# Patient Record
Sex: Female | Born: 1980 | Race: Black or African American | Hispanic: No | Marital: Single | Smoking: Never smoker
Health system: Southern US, Community
[De-identification: ages and names within clinical notes are randomized; demographics above are authoritative.]

## PROBLEM LIST (undated history)

## (undated) ENCOUNTER — Inpatient Hospital Stay (HOSPITAL_COMMUNITY): Payer: Self-pay

## (undated) DIAGNOSIS — K219 Gastro-esophageal reflux disease without esophagitis: Secondary | ICD-10-CM

## (undated) DIAGNOSIS — IMO0002 Reserved for concepts with insufficient information to code with codable children: Secondary | ICD-10-CM

## (undated) DIAGNOSIS — R51 Headache: Secondary | ICD-10-CM

## (undated) DIAGNOSIS — D649 Anemia, unspecified: Secondary | ICD-10-CM

## (undated) DIAGNOSIS — D219 Benign neoplasm of connective and other soft tissue, unspecified: Secondary | ICD-10-CM

## (undated) DIAGNOSIS — M7552 Bursitis of left shoulder: Secondary | ICD-10-CM

---

## 2000-05-06 ENCOUNTER — Ambulatory Visit (HOSPITAL_COMMUNITY): Admission: RE | Admit: 2000-05-06 | Discharge: 2000-05-06 | Payer: Self-pay | Admitting: *Deleted

## 2000-07-13 ENCOUNTER — Encounter (HOSPITAL_COMMUNITY): Admission: RE | Admit: 2000-07-13 | Discharge: 2000-07-23 | Payer: Self-pay | Admitting: Obstetrics & Gynecology

## 2000-07-21 ENCOUNTER — Inpatient Hospital Stay (HOSPITAL_COMMUNITY): Admission: AD | Admit: 2000-07-21 | Discharge: 2000-07-24 | Payer: Self-pay | Admitting: *Deleted

## 2000-08-24 ENCOUNTER — Inpatient Hospital Stay (HOSPITAL_COMMUNITY): Admission: AD | Admit: 2000-08-24 | Discharge: 2000-08-24 | Payer: Self-pay | Admitting: Obstetrics

## 2001-07-09 ENCOUNTER — Emergency Department (HOSPITAL_COMMUNITY): Admission: EM | Admit: 2001-07-09 | Discharge: 2001-07-09 | Payer: Self-pay | Admitting: Emergency Medicine

## 2001-08-02 ENCOUNTER — Emergency Department (HOSPITAL_COMMUNITY): Admission: EM | Admit: 2001-08-02 | Discharge: 2001-08-02 | Payer: Self-pay | Admitting: Emergency Medicine

## 2003-04-10 ENCOUNTER — Emergency Department (HOSPITAL_COMMUNITY): Admission: EM | Admit: 2003-04-10 | Discharge: 2003-04-10 | Payer: Self-pay | Admitting: Emergency Medicine

## 2004-01-28 DIAGNOSIS — IMO0002 Reserved for concepts with insufficient information to code with codable children: Secondary | ICD-10-CM

## 2004-01-28 DIAGNOSIS — R87619 Unspecified abnormal cytological findings in specimens from cervix uteri: Secondary | ICD-10-CM

## 2004-01-28 HISTORY — DX: Unspecified abnormal cytological findings in specimens from cervix uteri: R87.619

## 2004-01-28 HISTORY — DX: Reserved for concepts with insufficient information to code with codable children: IMO0002

## 2004-03-24 ENCOUNTER — Emergency Department (HOSPITAL_COMMUNITY): Admission: EM | Admit: 2004-03-24 | Discharge: 2004-03-24 | Payer: Self-pay | Admitting: Family Medicine

## 2004-06-30 ENCOUNTER — Emergency Department (HOSPITAL_COMMUNITY): Admission: EM | Admit: 2004-06-30 | Discharge: 2004-06-30 | Payer: Self-pay | Admitting: Emergency Medicine

## 2006-08-20 ENCOUNTER — Emergency Department (HOSPITAL_COMMUNITY): Admission: EM | Admit: 2006-08-20 | Discharge: 2006-08-20 | Payer: Self-pay | Admitting: Emergency Medicine

## 2008-02-11 ENCOUNTER — Emergency Department (HOSPITAL_COMMUNITY): Admission: EM | Admit: 2008-02-11 | Discharge: 2008-02-11 | Payer: Self-pay | Admitting: Family Medicine

## 2008-08-17 ENCOUNTER — Inpatient Hospital Stay (HOSPITAL_COMMUNITY): Admission: AD | Admit: 2008-08-17 | Discharge: 2008-08-22 | Payer: Self-pay | Admitting: Obstetrics and Gynecology

## 2008-08-18 ENCOUNTER — Encounter (INDEPENDENT_AMBULATORY_CARE_PROVIDER_SITE_OTHER): Payer: Self-pay | Admitting: Obstetrics & Gynecology

## 2008-08-18 ENCOUNTER — Encounter: Payer: Self-pay | Admitting: Obstetrics and Gynecology

## 2008-12-13 ENCOUNTER — Emergency Department (HOSPITAL_COMMUNITY): Admission: EM | Admit: 2008-12-13 | Discharge: 2008-12-13 | Payer: Self-pay | Admitting: Family Medicine

## 2009-12-17 IMAGING — US US OB COMP +14 WK
1 series · 14 of 28 positions shown · non-contrast
Comparison: none

OBSTETRICAL ULTRASOUND:
 This ultrasound exam was performed in the [HOSPITAL] Ultrasound Department.  The OB US report was generated in the AS system, and faxed to the ordering physician.  This report is also available in [REDACTED] PACS.

[Series 1: us ob comp +14 wk · 0.25mm/px · 71 acquisitions, 14 frames shown]
[im 3/71]
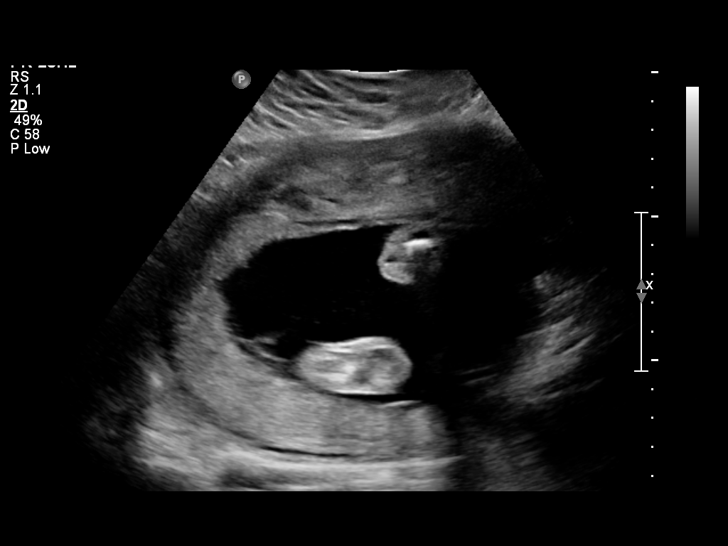
[im 8/71]
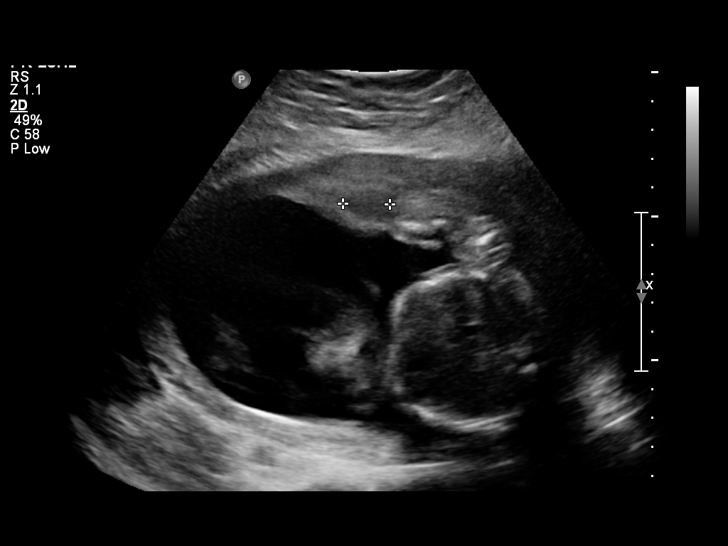
[im 13/71]
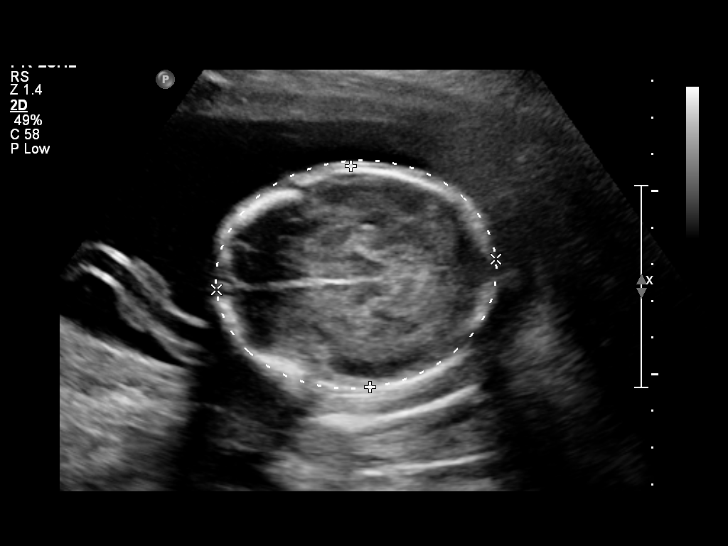
[im 19/71]
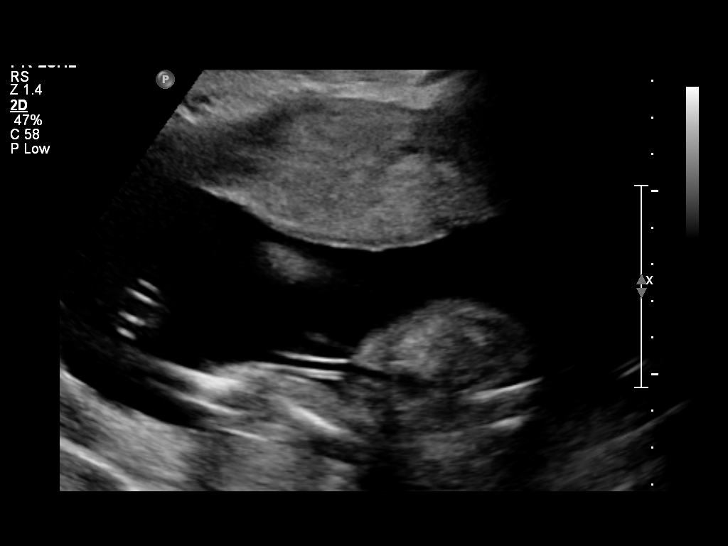
[im 24/71]
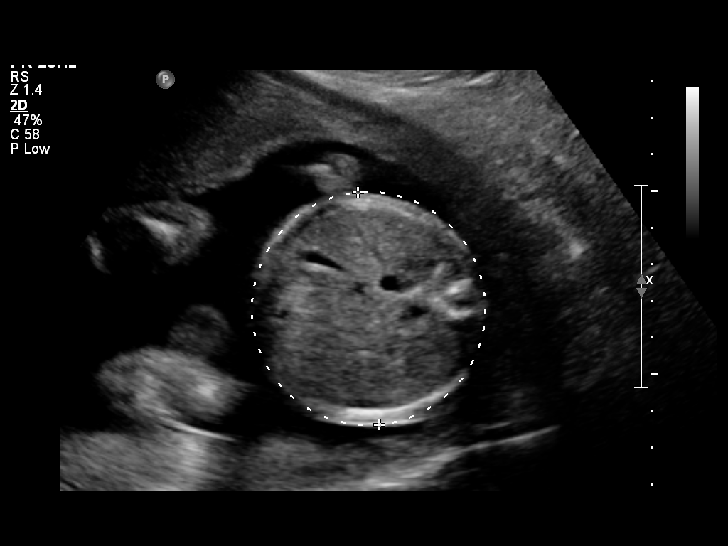
[im 29/71]
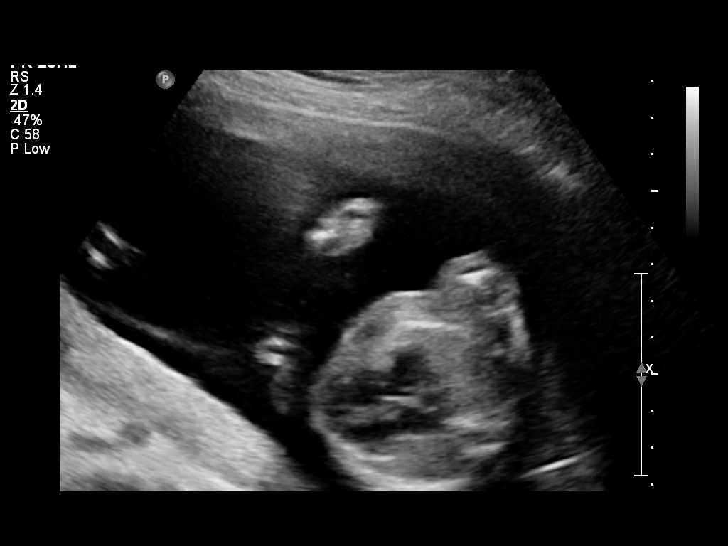
[im 34/71]
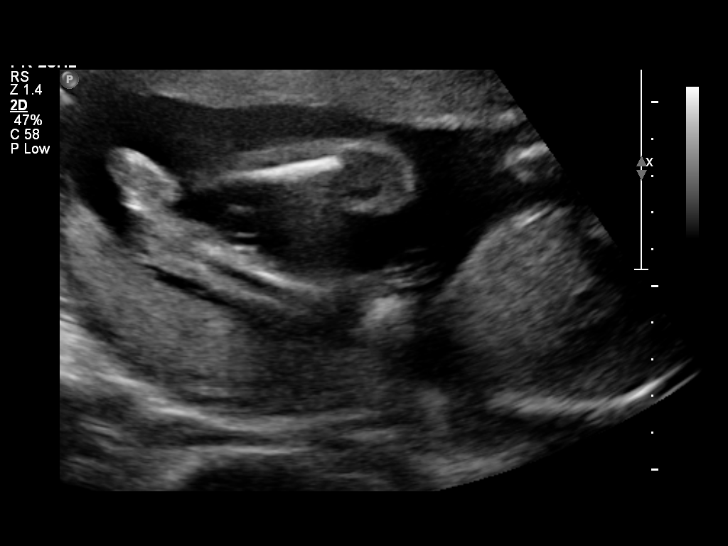
[im 39/71]
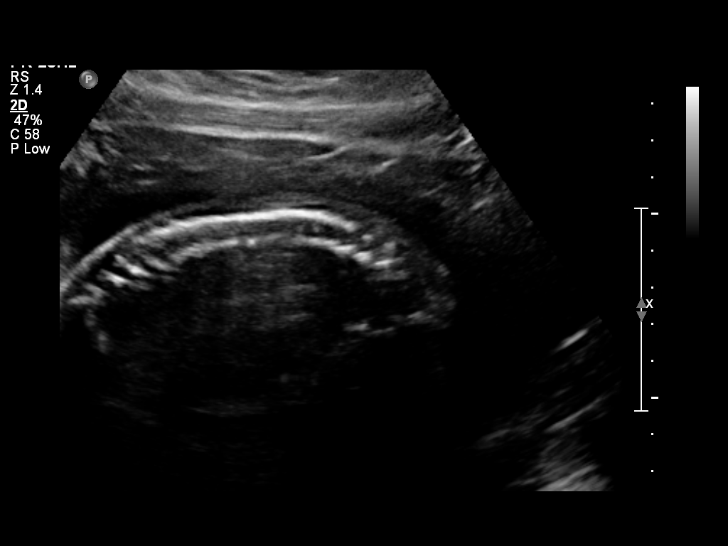
[im 45/71]
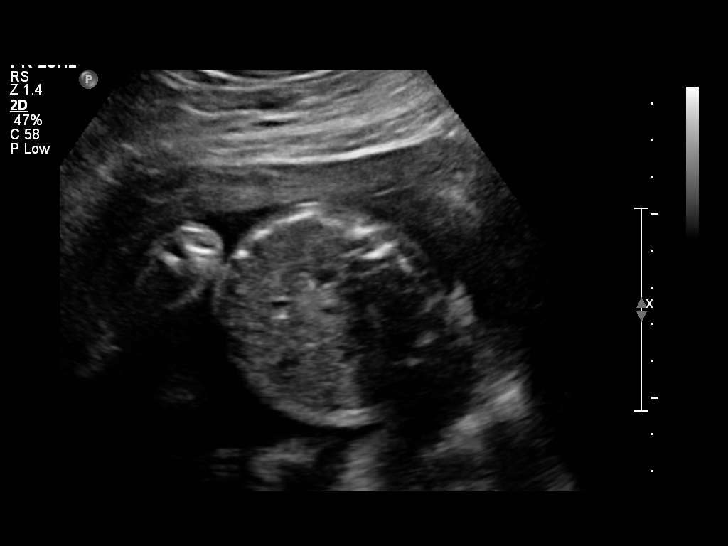
[im 50/71]
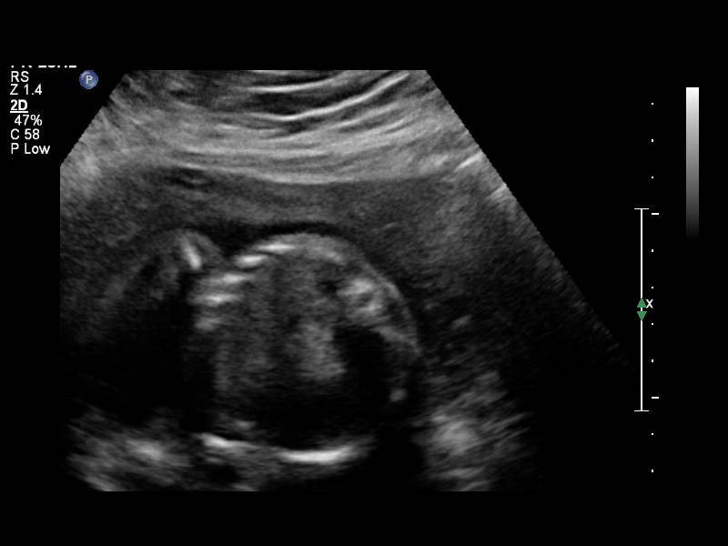
[im 55/71]
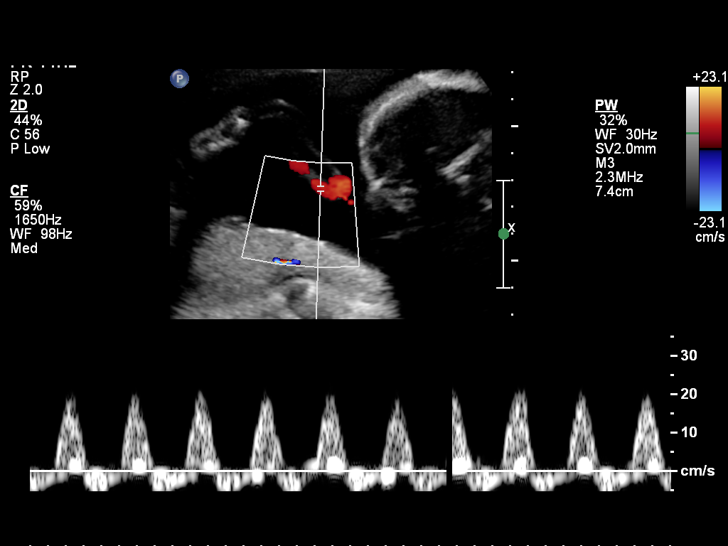
[im 60/71]
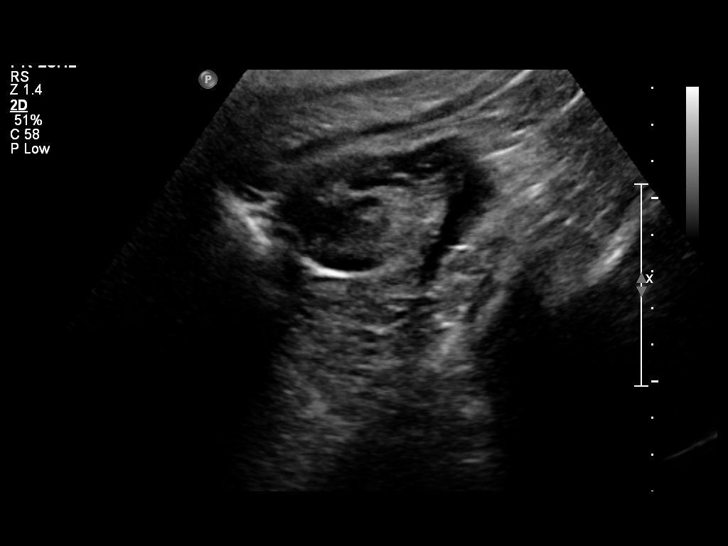
[im 65/71]
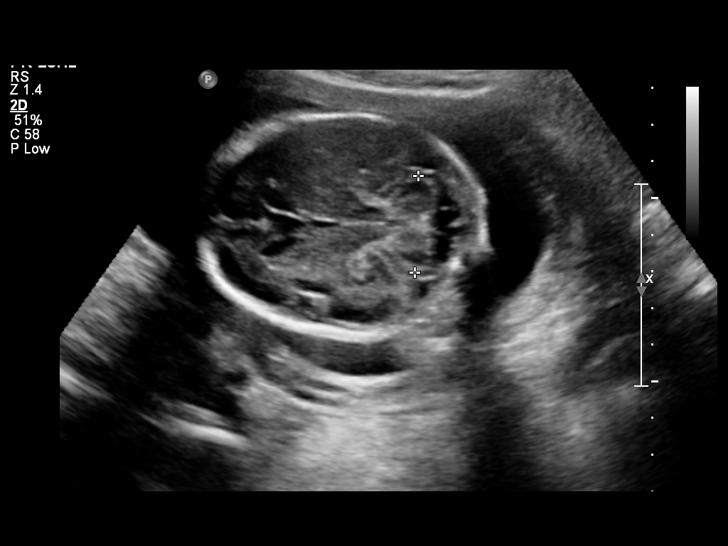
[im 71/71]
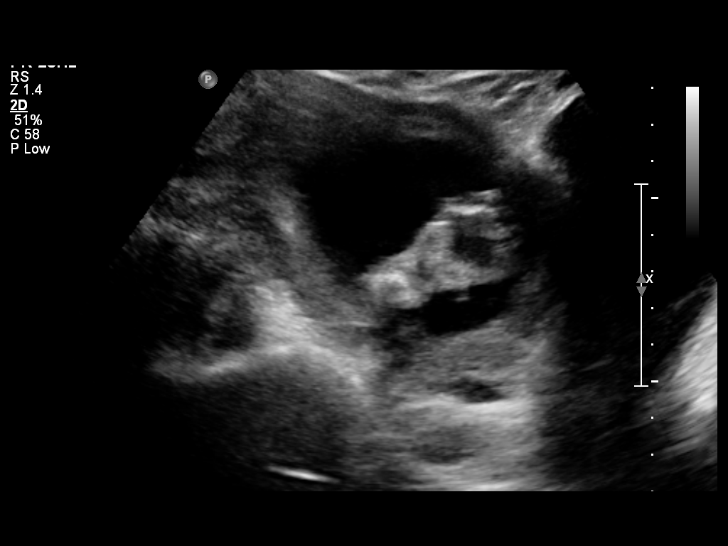

[14 of 28 positions shown; findings below may reference images not displayed]

IMPRESSION: See AS Obstetric US report.

## 2009-12-18 IMAGING — US US UA DOPPLER RE-EVAL
1 series · 10 of 10 positions shown · non-contrast
Comparison: none

OBSTETRICAL ULTRASOUND:
 This ultrasound was performed in The [HOSPITAL], and the AS OB/GYN report will be stored to [REDACTED] PACS.

[Series 1: us ua doppler re-eval · 10 of 10 slices shown]
[im 1/10]
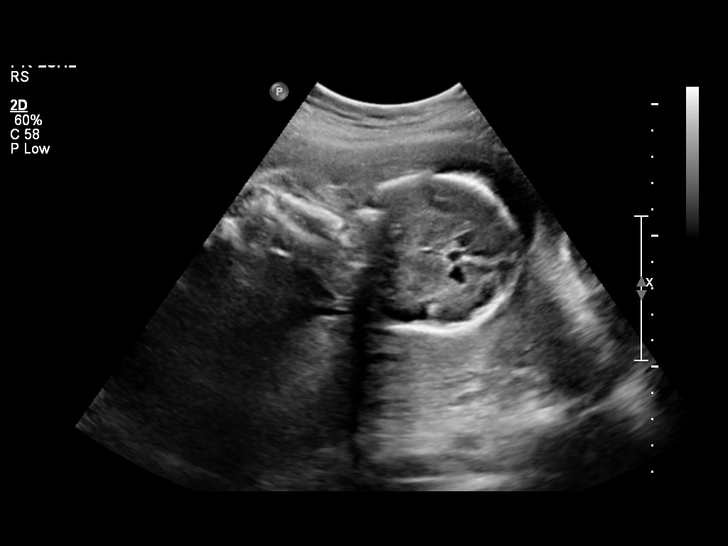
[im 2/10]
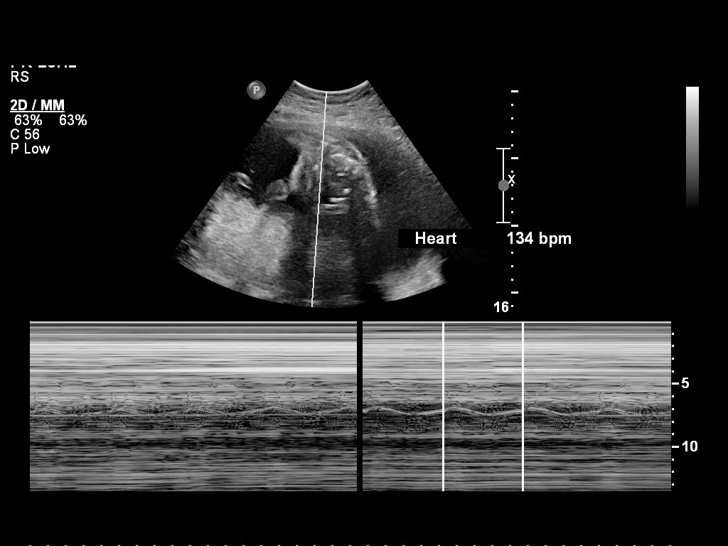
[im 3/10]
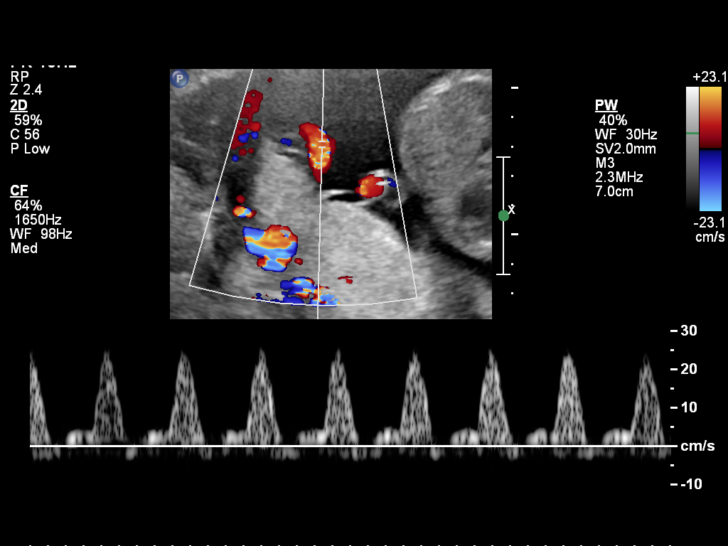
[im 4/10]
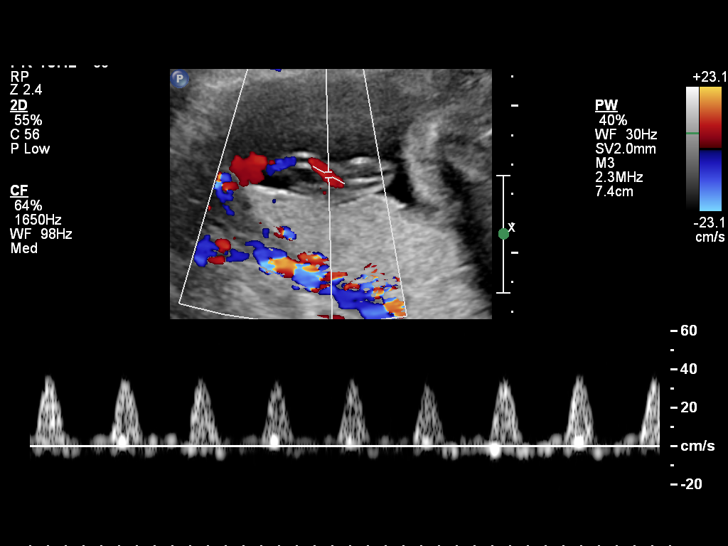
[im 5/10]
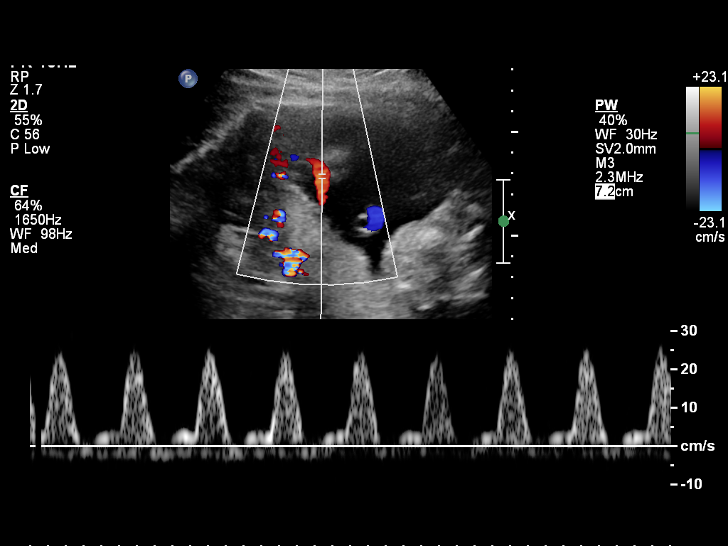
[im 6/10]
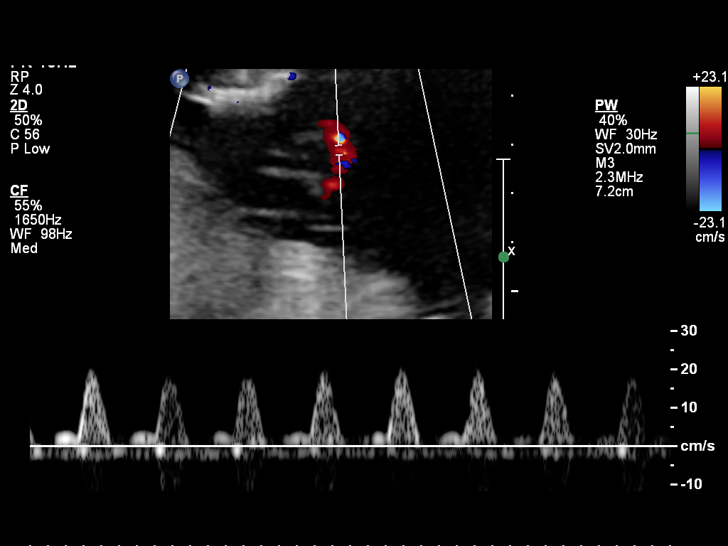
[im 7/10]
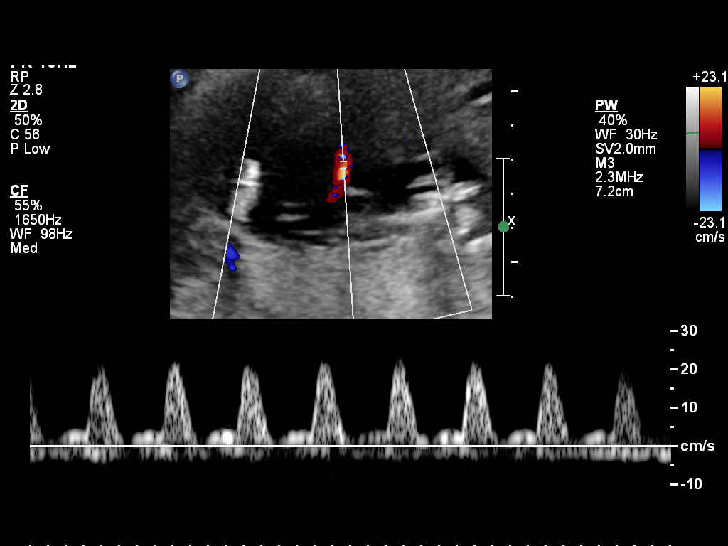
[im 8/10]
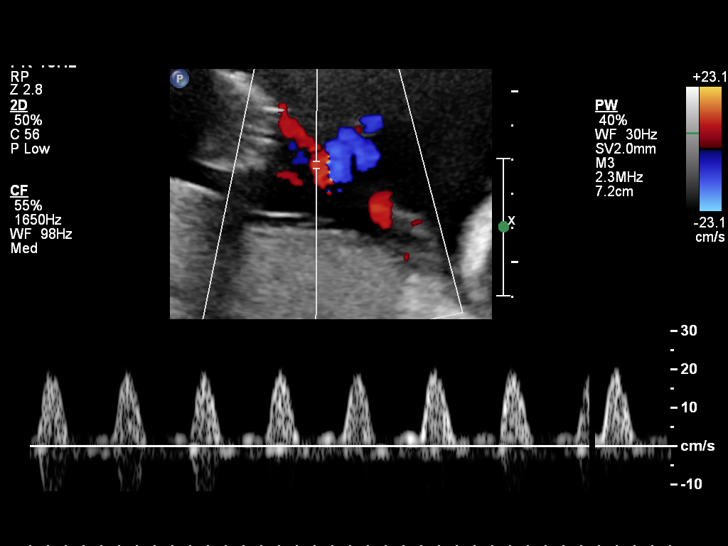
[im 9/10]
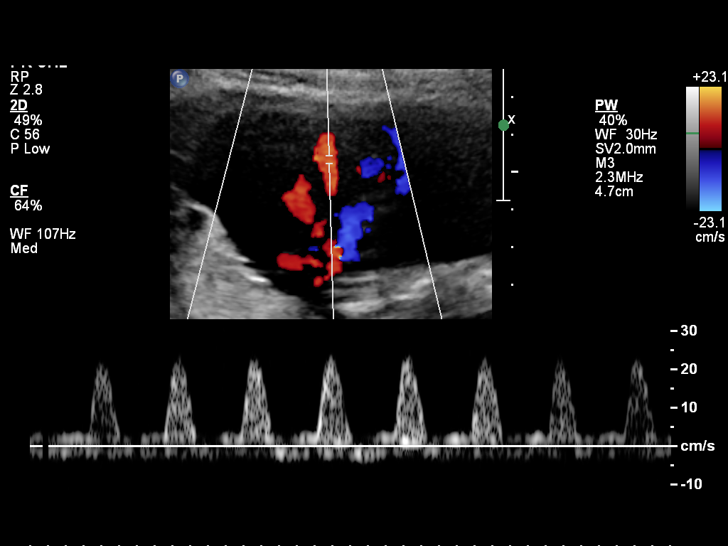
[im 10/10]
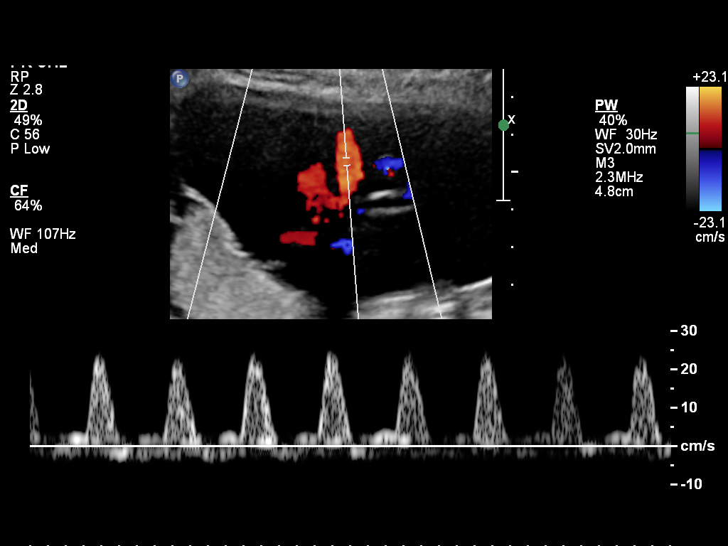

[10 of 10 positions shown; findings below may reference images not displayed]

IMPRESSION: AS OB/GYN has also been faxed to the ordering physician.

## 2010-05-05 LAB — CBC
HCT: 28.1 % — ABNORMAL LOW (ref 36.0–46.0)
HCT: 32.3 % — ABNORMAL LOW (ref 36.0–46.0)
Hemoglobin: 10.8 g/dL — ABNORMAL LOW (ref 12.0–15.0)
MCHC: 33.5 g/dL (ref 30.0–36.0)
MCV: 90.8 fL (ref 78.0–100.0)
RBC: 3.1 MIL/uL — ABNORMAL LOW (ref 3.87–5.11)
RDW: 14.2 % (ref 11.5–15.5)
WBC: 16.3 10*3/uL — ABNORMAL HIGH (ref 4.0–10.5)

## 2010-05-05 LAB — DIFFERENTIAL
Basophils Absolute: 0.1 10*3/uL (ref 0.0–0.1)
Basophils Relative: 1 % (ref 0–1)
Eosinophils Relative: 1 % (ref 0–5)
Monocytes Absolute: 0.4 10*3/uL (ref 0.1–1.0)

## 2010-05-05 LAB — COMPREHENSIVE METABOLIC PANEL
Alkaline Phosphatase: 70 U/L (ref 39–117)
BUN: 6 mg/dL (ref 6–23)
Calcium: 9.2 mg/dL (ref 8.4–10.5)
Creatinine, Ser: 0.47 mg/dL (ref 0.4–1.2)
Glucose, Bld: 85 mg/dL (ref 70–99)
Total Protein: 6.9 g/dL (ref 6.0–8.3)

## 2010-05-13 LAB — POCT RAPID STREP A (OFFICE): Streptococcus, Group A Screen (Direct): POSITIVE — AB

## 2010-06-11 NOTE — Op Note (Signed)
Stefanie Jimenez, Stefanie Jimenez             ACCOUNT NO.:  [id number]   MEDICAL RECORD NO.:  [id number]          PATIENT TYPE:  OUT   LOCATION:  MFM                           FACILITY:  [facility]   PHYSICIAN:  Gerrit Friends. Aldona Bar, M.D.   DATE OF BIRTH:  1980-10-21   DATE OF PROCEDURE:  08/18/2008  DATE OF DISCHARGE:  08/18/2008                               OPERATIVE REPORT   PREOPERATIVE DIAGNOSES:  A 26-week intrauterine pregnancy, reversed end-  diastolic flow on Dopplers, and nonreassuring fetal heart tracing.   POSTOPERATIVE DIAGNOSES:  A 26-week intrauterine pregnancy, reversed end-  diastolic flow on Dopplers, and nonreassuring fetal heart tracing, plus  delivery of 600 g female infant, Apgars 2 and 5.   PROCEDURE:  Primary low-transverse cesarean section.   SURGEON:  Gerrit Friends. Aldona Bar, MD   ASSISTANT:  Ilda Mori, MD   ANESTHESIA:  Spinal.   HISTORY:  This 30 year old patient was admitted on the evening of July  22 with decreased fetal movement and there was a concern because of size  less than dates and reversed end-diastolic flow on Dopplers.  She was  monitored carefully and had repeat Dopplers on the morning of July 23 as  well as a consultation with Dr. Rachel Bo, maternal fetal medicine.  Etiology was unclear, but nonetheless it was felt very significant that  the patient was having occasional decelerations of the fetal heart plus  reversed end-diastolic flow.  There was some discussion about transfer  to [facility] for in-house maternal fetal medicine monitoring, but  unfortunately, during the process of this discussion, the patient began  having significant decelerations that were somewhat repetitive and  further consultation with Dr. Rachel Bo advised immediate delivery.   The patient was taken to the operating room, where he was felt that a  quick spinal anesthetic could be carried out.   PROCEDURE:  After spinal anesthetic was carried out, the patient was  prepped and draped having  placed in the supine position slightly tilted  left.  A Foley catheter had been inserted prior.   Once the patient was adequately draped, procedure was begun.  A  Pfannenstiel incision was made with minimal difficulty dissected down  sharply to and through the fascia in the low-transverse fashion with  hemostasis created at each layer.  Subfascial space was created  inferiorly and superiorly, muscles separated in the midline, peritoneum  identified and entered appropriately with care taken to avoid the bowel  superiorly and the bladder inferiorly.  At this time, the bladder blade  was placed and an attempt was made to push to incise the vesicouterine  peritoneum and pushed off.  However, the uterus was very thick, so  decision was made, since the bladder was well below to proceed with  using Metzenbaum scissors and entered the uterine cavity in a low-  transverse fashion and extended laterally with the fingers.  Amniotomy  was produced and fluid appeared slightly green tinged.  Thereafter, from  a vertex position, a viable female infant was delivered.  The infant was  passed off the awaiting team after the cord was clamped and cut.  Thereafter, the care of the infant was assumed by the neonatologist and  baby was taken to the neonatal intensive care unit.  Subsequent weight  was found to be 600 g, 1 pound 5 ounces, the infant was female and Apgars  were assigned at 2 and 5.   An attempt was made to draw the arterial cord pH as well as cord bloods.  Placenta was then delivered intact and appeared grossly normal and was  sent to pathology appropriately labeled.  Inspection of the cavity found  no further products of conception and with good uterine contractility  afforded with slowly given intravenous Pitocin.  Closure of the uterine  incision was carried out using a single layer of #1 Vicryl in a running  locking fashion.  An additional figure-of-eight #1 Vicryl was placed for  additional  hemostasis, but at this time, the uterine incision looked dry  and the uterus is well contracted.  Tubes and ovaries appeared totally  normal.  At this time, the uterus was replaced into the abdominal  incision after the abdomen was lavaged of all free blood and clot and no  foreign bodies noted to be remaining in the abdominal cavity.  Closure  of the abdominal cavity was then carried out in layers.  The abdominal  peritoneum was closed with 0 Vicryl, no muscle secured with same.  Assured of good subfascial hemostasis.  The fascia was then  reapproximated using 0 Vicryl from angle to midline bilaterally.  Subcutaneous tissues was rendered hemostatic and staples were then used  to close the skin.  Sterile pressure dressing was applied.  The patient  at this time was transported to the recovery room having tolerated the  procedure well.  Baby was as mentioned taken to the newborn intensive  care unit with a care was assumed by the neonatology team.   Arterial cord pH returned at 7.19.   Estimated blood loss 510 mL.  All counts correct x2.  In summary, this  patient presented a 30 weeks' gestation with decreased fetal movement  and in addition to having a nonreassuring fetal heart tracing, had  reversed end-diastolic flow on Doppler and eventually repetitive  decelerations necessitated immediate delivery.      Gerrit Friends. Aldona Bar, M.D.  Electronically Signed     RMW/MEDQ  D:  08/18/2008  T:  08/19/2008  Job:  [id number]

## 2010-06-14 NOTE — Discharge Summary (Signed)
NAME:  Stefanie Jimenez, Stefanie Jimenez             ACCOUNT NO.:  [id number]   MEDICAL RECORD NO.:  [id number]          PATIENT TYPE:  INP   LOCATION:  9303                          FACILITY:  [facility]   PHYSICIAN:  Kendra H. Tenny Craw, MD     DATE OF BIRTH:  [birth year]   DATE OF ADMISSION:  08/17/2008  DATE OF DISCHARGE:  08/22/2008                               DISCHARGE SUMMARY   FINAL DIAGNOSES:  Intrauterine pregnancy at this 26 weeks' gestation,  reversed end-diastolic flow on Dopplers, decreased fetal movement, size  less than dates, and nonreassuring fetal heart tracing.   PROCEDURE:  Primary low transverse cesarean section.   SURGEON:  Gerrit Friends. Aldona Bar, MD   ASSISTANT:  Ilda Mori, MD   COMPLICATIONS:  None.   HISTORY:  This 30 year old G2, P1-0-0-1, presents with complaints of  decreased fetal movement.  The patient was not having any contractions,  bleeding.  Antepartum course up to this point had been completely benign  with normal prenatal care starting around 9 weeks' gestation.  Upon exam  by Dr. Henderson Cloud on admission, the patient's cervix was closed and thick.  There was an estimated fetal weight of 657 g, 6/8 BPP with a normal  appearing AFI, but there was some reverse flow noted on Dopplers.  Fetal  heart tones at this point were reactive with some questionable late  variables.  The patient was of course admitted.  Terbutaline was  started, call any possible contractions, the patient's strip was  changed.  NICU consult was obtained.  Final ultrasound showed symmetric  growth in the 14th percentile like I said normal AFI.  Maternal Fetal  Medicine was consulted on the patient.  During the discussion, Maternal  Fetal Medicine on the patient's course, the patient began having some  significant decelerations, which were repetitive and at this point in  the delivery was needed.  The patient was taken to the operating room on  August 18, 2008 by Dr. Annamaria Helling, where a primary low transverse  cesarean section was performed with the delivery of a 1 pound 5 ounces  female infant with Apgars of 2 and 5, attempts were made to draw arterial  cord pH with cord blood.  Delivery went without complications.  The  patient's postoperative course was benign without any significant  fevers.  Baby was stable in the NICU upon the patient's discharge.  The  patient was then kept on postoperative day #4.  She was sent home on a  regular diet, told to decrease her activities, told to continue her  prenatal vitamins, was given Percocet 1-2 every 4-6 hours as needed for  pain, was to follow up in our office in 4 weeks.  Instructions and  precautions were reviewed with the patient.  Baby was in the NICU stable  during this discharge.   LABS ON DISCHARGE:  The patient had a hemoglobin of 9.5, white blood  cell count of 16.3, and platelets of 212,000.      [name], P.A.-C.      Freddrick March. Tenny Craw, MD  Electronically Signed    MB/MEDQ  D:  09/10/2008  T:  09/11/2008  Job:  [id number]

## 2010-10-04 ENCOUNTER — Inpatient Hospital Stay (HOSPITAL_COMMUNITY)
Admission: AD | Admit: 2010-10-04 | Discharge: 2010-10-04 | Disposition: A | Discharge status: Home / Self Care | Payer: Medicaid Other | Source: Ambulatory Visit | Attending: Family Medicine | Admitting: Family Medicine

## 2010-10-04 ENCOUNTER — Encounter (HOSPITAL_COMMUNITY): Payer: Self-pay | Admitting: *Deleted

## 2010-10-04 DIAGNOSIS — R42 Dizziness and giddiness: Secondary | ICD-10-CM | POA: Insufficient documentation

## 2010-10-04 DIAGNOSIS — D649 Anemia, unspecified: Secondary | ICD-10-CM | POA: Insufficient documentation

## 2010-10-04 DIAGNOSIS — O99019 Anemia complicating pregnancy, unspecified trimester: Secondary | ICD-10-CM | POA: Insufficient documentation

## 2010-10-04 DIAGNOSIS — Z3490 Encounter for supervision of normal pregnancy, unspecified, unspecified trimester: Secondary | ICD-10-CM

## 2010-10-04 DIAGNOSIS — R51 Headache: Secondary | ICD-10-CM | POA: Insufficient documentation

## 2010-10-04 DIAGNOSIS — O99891 Other specified diseases and conditions complicating pregnancy: Secondary | ICD-10-CM | POA: Insufficient documentation

## 2010-10-04 DIAGNOSIS — R519 Headache, unspecified: Secondary | ICD-10-CM

## 2010-10-04 HISTORY — DX: Anemia, unspecified: D64.9

## 2010-10-04 HISTORY — DX: Bursitis of left shoulder: M75.52

## 2010-10-04 HISTORY — DX: Benign neoplasm of connective and other soft tissue, unspecified: D21.9

## 2010-10-04 HISTORY — DX: Reserved for concepts with insufficient information to code with codable children: IMO0002

## 2010-10-04 LAB — CBC
Hemoglobin: 10.7 g/dL — ABNORMAL LOW (ref 12.0–15.0)
MCH: 28.7 pg (ref 26.0–34.0)
RBC: 3.73 MIL/uL — ABNORMAL LOW (ref 3.87–5.11)

## 2010-10-04 LAB — URINALYSIS, ROUTINE W REFLEX MICROSCOPIC
Bilirubin Urine: NEGATIVE
Nitrite: NEGATIVE
Specific Gravity, Urine: 1.02 (ref 1.005–1.030)
pH: 7 (ref 5.0–8.0)

## 2010-10-04 MED ORDER — IBUPROFEN 800 MG PO TABS
800.0000 mg | ORAL_TABLET | Freq: Once | ORAL | Status: AC
  Administered 2010-10-04: 800 mg via ORAL
  Filled 2010-10-04: qty 1

## 2010-10-04 MED ORDER — IBUPROFEN 600 MG PO TABS: 600.0000 mg | ORAL_TABLET | Freq: Four times a day (QID) | ORAL | Status: AC | PRN

## 2010-10-04 NOTE — Progress Notes (Addendum)
Unable to Hear FHT, UPT + at [facility] ,    Has appt with Dr. Tenny Craw Wednesday, had her with other preg, fetal loss at 27 weeks due to cord accident per pt, lived 27 days

## 2010-10-04 NOTE — Progress Notes (Signed)
HA x 2 weeks, tylenol this AM , pregnant [redacted] weeks

## 2010-10-04 NOTE — ED Provider Notes (Signed)
Stefanie Jimenez is a 30 y.o. G3P119female at [redacted]w[redacted]d presenting for HA x 2 weeks that was responding well to Tylenol, but is not now. She reports dizziness upon standing and denies fever, chills, scotoma, congestion, allergy Sx, weakness, difficulty w/ gait or speech, changes in stress level. She has had similar HA's in the past that did not recur so frequently. She denies VB or LOF. She states she is schedule to start [facility]  W/ Dr. Tenny Craw on 10/09/10, but Dr. Tenny Craw requests that [facility] be the provider since the pt has not actually started care.  History OB History    Grav Para Term Preterm Abortions TAB SAB Ect Mult Living   3 2 1 1  0 0 0 0 0 1     Past Medical History  Diagnosis Date  . Anemia   . Bursitis of shoulder, left   . Fibroid   . 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 Cesarean section    Family History: family history is not on file. Social History:  reports that she has quit smoking. She does not have any smokeless tobacco history on file. She reports that she does not drink alcohol or use illicit drugs.  Review of Systems  Constitutional: Negative.   HENT: Negative for ear pain, congestion, sore throat, neck pain and ear discharge.   Eyes: Negative for blurred vision, photophobia and pain.  Musculoskeletal: Negative for myalgias.  Skin: Negative.   Neurological: Positive for dizziness (upon standing) and headaches (Bilat temporal, dull). Negative for sensory change, speech change, focal weakness, seizures and loss of consciousness.  Endo/Heme/Allergies: Negative for environmental allergies.     Blood pressure 116/71, pulse 92, temperature 98.5 F (36.9 C), resp. rate 18, height 5\' 5"  (1.651 m), weight 112.038 kg (247 lb), last menstrual period 06/21/2010. Maternal Exam:  Abdomen: not evaluated.  Introitus: not evaluated.     Fetal Exam Fetal Monitor Review: Mode: hand-held doppler probe.   Baseline  rate: 156.      Physical Exam  Constitutional: She is oriented to person, place, and time.  Musculoskeletal:       Right shoulder: She exhibits normal strength.  Neurological: She is alert and oriented to person, place, and time. She has normal strength and normal reflexes. No cranial nerve deficit or sensory deficit.    Prenatal labs: ABO, Rh:   Antibody:   Rubella:   RPR:    HBsAg:    HIV:    GBS:     Assessment/Plan: Assessment: 1. HA resolved w/ Ibuprofen and PO fluids 2. Mild anemia  Plan: 1. Discussed non-pharmacologic Tx of HA. 2. May take Ibuprofen 600 mg PO Q6 PRN until end of second trimester 3. F/U AS on 10/09/10 for NOB w/ Dr. Tenny Craw or PRN for worsening Sx.  4. Take PNV and increase dietary iron.  Dorathy Kinsman 10/04/2010, 4:29 PM

## 2010-10-04 NOTE — Progress Notes (Addendum)
Headache off and on for past 2 wks., tylenol had been helping. No relief this morning. Denies hx of migraine or tension headaches. Has not started [facility] yet,first appt next Wed. Has been dizzy at times, not today, denies visual changes or light sensativity

## 2010-10-07 NOTE — ED Provider Notes (Signed)
Chart reviewed and agree with management and plan.  

## 2010-10-09 ENCOUNTER — Other Ambulatory Visit: Payer: Self-pay | Admitting: Obstetrics and Gynecology

## 2010-10-09 LAB — HEPATITIS B SURFACE ANTIGEN: Hepatitis B Surface Ag: NEGATIVE

## 2010-10-09 LAB — RUBELLA ANTIBODY, IGM: Rubella: IMMUNE

## 2010-10-09 LAB — RPR: RPR: NONREACTIVE

## 2010-11-11 LAB — POCT RAPID STREP A: Streptococcus, Group A Screen (Direct): POSITIVE — AB

## 2010-12-02 DIAGNOSIS — O368190 Decreased fetal movements, unspecified trimester, not applicable or unspecified: Secondary | ICD-10-CM

## 2010-12-12 ENCOUNTER — Inpatient Hospital Stay (HOSPITAL_COMMUNITY)
Admission: AD | Admit: 2010-12-12 | Discharge: 2010-12-12 | Disposition: A | Discharge status: Home / Self Care | Payer: Medicaid Other | Source: Ambulatory Visit | Attending: Obstetrics and Gynecology | Admitting: Obstetrics and Gynecology

## 2010-12-12 DIAGNOSIS — O368190 Decreased fetal movements, unspecified trimester, not applicable or unspecified: Secondary | ICD-10-CM | POA: Insufficient documentation

## 2010-12-12 NOTE — ED Provider Notes (Signed)
History     Chief Complaint  Patient presents with  . Decreased Fetal Movement   HPIHattie I Campbell30 y.o. 30 y.o.[redacted]w[redacted]d presents with report of decreased fetal movement.  G3 P1 1 0 1.  Had a premature delivery at 26 weeks with preeclampsia  infant lived 28 days.  Denies vaginal bleeding or leaking of fluid.   Denies abdominal pain/contractions.      Past Medical History  Diagnosis Date  . Anemia   . Bursitis of shoulder, left   . Fibroid   . 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 Cesarean section     No family history on file.  History  Substance Use Topics  . Smoking status: Former Games developer  . Smokeless tobacco: Not on file  . Alcohol Use: No    Allergies: No Known Allergies  Prescriptions prior to admission  Medication Sig Dispense Refill  . acetaminophen (TYLENOL) 500 MG tablet Take 1,000 mg by mouth every 6 (six) hours as needed. Patient used this medication for a headache that she has had all week.       . prenatal vitamin w/FE, FA (PRENATAL 1 + 1) 27-1 MG TABS Take 1 tablet by mouth daily.          Review of Systems  Constitutional: Negative.   Gastrointestinal: Negative for nausea, vomiting and abdominal pain.  Genitourinary: Negative.        Denies vaginal bleeding   Physical Exam   Blood pressure 118/75, pulse 83, temperature 98.7 F (37.1 C), temperature source Oral, resp. rate 18, last menstrual period 06/21/2010.  Physical Exam  Constitutional: She is oriented to person, place, and time. She appears well-developed and well-nourished. No distress.  Respiratory: Effort normal.  GI: Soft.  Neurological: She is alert and oriented to person, place, and time.  Skin: Skin is warm and dry.   Fetal movement is heard on the monitor in the room.  FMS baseline 150.    MAU Course  Procedures  MDM 19:32 Called Dr. Tenny Craw left message to call.  She called right back,  Reported MSE and fetal heart  rate of 150.  Dr. Tenny Craw reviewed patient's history of previous complicated pregnancy and Dr. Tenny Craw stated this pregnacy has been uncomplicated.  Order given by Dr. Tenny Craw to discharge patient and keep scheduled appt for 11/21.             Patient states she feels much relief hearing the heartbeat and seeing tracing.  Assessment and Plan  A:  Decreased Fetal Movement  P:  Reassured       Keep scheduled appointment for 11/21    Call your doctor for any further concerns.  Yoshiaki Kreuser,EVE M 12/12/2010, 7:17 PM   Matt Holmes, NP 12/12/10 1941

## 2010-12-12 NOTE — Progress Notes (Signed)
Pt states she last felt the baby move this morning about 0800

## 2011-02-16 ENCOUNTER — Inpatient Hospital Stay (HOSPITAL_COMMUNITY)
Admission: AD | Admit: 2011-02-16 | Discharge: 2011-02-16 | Disposition: A | Discharge status: Home / Self Care | Payer: Medicaid Other | Source: Ambulatory Visit | Attending: Obstetrics and Gynecology | Admitting: Obstetrics and Gynecology

## 2011-02-16 ENCOUNTER — Encounter (HOSPITAL_COMMUNITY): Payer: Self-pay | Admitting: *Deleted

## 2011-02-16 DIAGNOSIS — R109 Unspecified abdominal pain: Secondary | ICD-10-CM | POA: Insufficient documentation

## 2011-02-16 DIAGNOSIS — O99891 Other specified diseases and conditions complicating pregnancy: Secondary | ICD-10-CM | POA: Insufficient documentation

## 2011-02-16 DIAGNOSIS — O26899 Other specified pregnancy related conditions, unspecified trimester: Secondary | ICD-10-CM

## 2011-02-16 DIAGNOSIS — M549 Dorsalgia, unspecified: Secondary | ICD-10-CM | POA: Insufficient documentation

## 2011-02-16 LAB — URINE CULTURE
Colony Count: 40000
Culture  Setup Time: 201301201808

## 2011-02-16 LAB — URINALYSIS, ROUTINE W REFLEX MICROSCOPIC
Bilirubin Urine: NEGATIVE
Specific Gravity, Urine: 1.02 (ref 1.005–1.030)
Urobilinogen, UA: 1 mg/dL (ref 0.0–1.0)
pH: 6.5 (ref 5.0–8.0)

## 2011-02-16 LAB — URINE MICROSCOPIC-ADD ON

## 2011-02-16 NOTE — Progress Notes (Signed)
Onset of pain on right side and right flank area since this morning pain has gotten worse, no burning with urination, no vaginal discharge or bleeding.

## 2011-02-16 NOTE — Progress Notes (Signed)
Pt vomited 100cc of yellow emesis. Stated her right flank pain has gone down to a 6/10 and she feels much more comfortable. Able to lay still in bed at this time

## 2011-02-16 NOTE — ED Provider Notes (Signed)
History     CSN: [id number]  Arrival date & time 02/16/11  0941   None     Chief Complaint  Patient presents with  . Back Pain    (Consider location/radiation/quality/duration/timing/severity/associated sxs/prior treatment) HPIHattie I Jimenez is a 31 y.o.  G3P1101 [redacted]w[redacted]d. She presents with c/o R back/side/abd pain since woke up this am and rolled over in the bed. Pain stayed the same after voiding, worse after drinking glass of water. Took warm shower, couldn't get comfortable so came to MAU. Unable for pt to get settled when arrived, unable to place on FM. Pt vomited x 1, pain has resolved. Good fetal activity, ? 1 UC since arrived.  Past Medical History  Diagnosis Date  . Anemia   . Bursitis of shoulder, left   . Fibroid   . 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 Cesarean section     No family history on file.  History  Substance Use Topics  . Smoking status: Former Games developer  . Smokeless tobacco: Not on file  . Alcohol Use: No    OB History    Grav Para Term Preterm Abortions TAB SAB Ect Mult Living   3 2 1 1  0 0 0 0 0 1      Review of Systems  Constitutional: Positive for chills and diaphoresis.  Gastrointestinal:       Abd pain, side pain, back pain  Genitourinary: Positive for flank pain. Negative for dysuria, urgency, frequency, hematuria, vaginal bleeding, vaginal discharge and pelvic pain.  Psychiatric/Behavioral: Negative.     Allergies  Review of patient's allergies indicates no known allergies.  Home Medications  No current outpatient prescriptions on file.  BP 118/74  Pulse 126  Temp(Src) 98.5 F (36.9 C) (Oral)  Resp 16  Ht 5\' 5"  (1.651 m)  Wt 115.577 kg (254 lb 12.8 oz)  BMI 42.40 kg/m2  LMP 06/21/2010  Physical Exam  Constitutional: She is oriented to person, place, and time. She appears well-developed and well-nourished. No distress.  Pulmonary/Chest: Effort normal.    Abdominal: Soft. There is no tenderness. There is no guarding.       34 wk gravid uterus  Genitourinary:        Cx close, long per RN exam  Musculoskeletal: Normal range of motion.  Neurological: She is alert and oriented to person, place, and time.  Skin: Skin is warm and dry. She is not diaphoretic.  Psychiatric: She has a normal mood and affect. Her behavior is normal.    ED Course  Procedures (including critical care time)   Labs Reviewed  URINALYSIS, ROUTINE W REFLEX MICROSCOPIC   No results found. Results for orders placed during the hospital encounter of 02/16/11 (from the past 24 hour(s))  URINALYSIS, ROUTINE W REFLEX MICROSCOPIC     Status: Abnormal   Collection Time   02/16/11  9:50 AM      Component Value Range   Color, Urine YELLOW  YELLOW    APPearance HAZY (*) CLEAR    Specific Gravity, Urine 1.020  1.005 - 1.030    pH 6.5  5.0 - 8.0    Glucose, UA NEGATIVE  NEGATIVE (mg/dL)   Hgb urine dipstick TRACE (*) NEGATIVE    Bilirubin Urine NEGATIVE  NEGATIVE    Ketones, ur NEGATIVE  NEGATIVE (mg/dL)   Protein, ur 30 (*) NEGATIVE (mg/dL)   Urobilinogen, UA 1.0  0.0 - 1.0 (mg/dL)  Nitrite NEGATIVE  NEGATIVE    Leukocytes, UA TRACE (*) NEGATIVE   URINE MICROSCOPIC-ADD ON     Status: Abnormal   Collection Time   02/16/11  9:50 AM      Component Value Range   Squamous Epithelial / LPF MANY (*) RARE    WBC, UA 7-10  <3 (WBC/hpf)   RBC / HPF 0-2  <3 (RBC/hpf)   Bacteria, UA MANY (*) RARE    ASSESSMENT: abd pain at 34 wks, spont resolved after vomiting, unknown etiology ? UTI- dirty CC urine, will do C&S No contractions, reactive strip, cx closed  PLAN:  C&S on urine, pt to call if starts getting symptoms PT labor precautions Keep next PNV in office 1/23 Consulted with Dr Tenny Craw       MDM          Avon Gully. Tiburcio Pea, NP 02/16/11 1139

## 2011-03-25 ENCOUNTER — Encounter (HOSPITAL_COMMUNITY): Payer: Self-pay | Admitting: Pharmacist

## 2011-03-25 ENCOUNTER — Encounter (HOSPITAL_COMMUNITY): Payer: Self-pay

## 2011-03-26 ENCOUNTER — Encounter (HOSPITAL_COMMUNITY)
Admission: RE | Admit: 2011-03-26 | Discharge: 2011-03-26 | Disposition: A | Discharge status: Home / Self Care | Payer: Medicaid Other | Source: Ambulatory Visit | Attending: Obstetrics and Gynecology | Admitting: Obstetrics and Gynecology

## 2011-03-26 ENCOUNTER — Encounter (HOSPITAL_COMMUNITY): Payer: Self-pay

## 2011-03-26 HISTORY — DX: Headache: R51

## 2011-03-26 HISTORY — DX: Gastro-esophageal reflux disease without esophagitis: K21.9

## 2011-03-26 LAB — CBC
Hemoglobin: 10.3 g/dL — ABNORMAL LOW (ref 12.0–15.0)
Platelets: 318 10*3/uL (ref 150–400)
RBC: 3.65 MIL/uL — ABNORMAL LOW (ref 3.87–5.11)
WBC: 11 10*3/uL — ABNORMAL HIGH (ref 4.0–10.5)

## 2011-03-26 LAB — RPR: RPR Ser Ql: NONREACTIVE

## 2011-03-26 LAB — SURGICAL PCR SCREEN
MRSA, PCR: NEGATIVE
Staphylococcus aureus: NEGATIVE

## 2011-03-26 NOTE — H&P (Signed)
Stefanie Jimenez is a 31 y.o. female [id number] presenting for repeat cesarean section.  The patient presents for a repeat cesarean section.  This pregnancy has been relatively uncomplicated.  The patient has a h/o of one prior vaginal delivery at term and a 26 week cesarean delivery for IUGR and abnormal umbilical artery dopplers.  That infant died at 48 days of life due to primary CMV.   The patient had desired a VBAC if she delivered prior to 40 weeks but repeat cesarean section if still pregnant on due date. R/B/A of procedure d/w patient and she wishes to proceed with delivery. History OB History    Grav Para Term Preterm Abortions TAB SAB Ect Mult Living   3 2 1 1  0 0 0 0 0 1     Past Medical History  Diagnosis Date  . Anemia   . Bursitis of shoulder, left   . Fibroid   . Abnormal Pap smear 2006    herpes,never had outbreak  . Headache   . GERD (gastroesophageal reflux disease)     no meds, worsens with pregnancy   Past Surgical History  Procedure Date  . Cesarean section    Family History: family history is not on file. Social History:  reports that she has never smoked. She does not have any smokeless tobacco history on file. She reports that she does not drink alcohol or use illicit drugs.  ROS: as above    Last menstrual period 06/21/2010. Exam Physical Exam  Prenatal labs: ABO, Rh: O/Positive/-- (09/12 0000) Antibody: Negative (09/12 0000) Rubella: Immune (09/12 0000) RPR: Nonreactive (09/12 0000)  HBsAg: Negative (09/12 0000)  HIV: Non-reactive (09/12 0000)  GBS:   Negative  Assessment/Plan: Admit 2gm Ancef OCTOR Proceed with repeat cesarean section  Percell Lamboy H. 03/26/2011, 11:01 PM

## 2011-03-26 NOTE — Patient Instructions (Addendum)
   Your procedure is scheduled NW:[id number] Feb 28th  Enter through the Main Entrance of [facility] at: 12 noon Pick up the phone at the desk and dial [phone] and inform us of your arrival.  Please call this number if you have any problems the morning of surgery: [phone]  Remember: Do not eat food after midnight: Wednesday Do not drink clear liquids after:9:30 am Thursday Take these medicines the morning of surgery with a SIP OF WATER: none  Do not wear jewelry, make-up, or FINGER nail polish Do not wear lotions, powders, perfumes or deodorant. Do not shave 48 hours prior to surgery. Do not bring valuables to the hospital. Contacts, dentures or bridgework may not be worn into surgery.  Leave suitcase in the car. After Surgery it may be brought to your room. For patients being admitted to the hospital, checkout time is 11:00am the day of discharge.     Remember to use your hibiclens as instructed.Please shower with 1/2 bottle the evening before your surgery and the other 1/2 bottle the morning of surgery. Neck down avoiding private area.

## 2011-03-27 ENCOUNTER — Encounter (HOSPITAL_COMMUNITY): Payer: Self-pay | Admitting: *Deleted

## 2011-03-27 ENCOUNTER — Encounter (HOSPITAL_COMMUNITY)
Admission: RE | Disposition: A | Discharge status: Home / Self Care | Payer: Self-pay | Source: Ambulatory Visit | Attending: Obstetrics and Gynecology

## 2011-03-27 ENCOUNTER — Inpatient Hospital Stay (HOSPITAL_COMMUNITY)
Admission: RE | Admit: 2011-03-27 | Discharge: 2011-03-30 | DRG: 766 | Disposition: A | Discharge status: Home / Self Care | Payer: Medicaid Other | Source: Ambulatory Visit | Attending: Obstetrics and Gynecology | Admitting: Obstetrics and Gynecology

## 2011-03-27 ENCOUNTER — Encounter (HOSPITAL_COMMUNITY): Payer: Self-pay | Admitting: General Surgery

## 2011-03-27 ENCOUNTER — Encounter (HOSPITAL_COMMUNITY): Payer: Self-pay | Admitting: Anesthesiology

## 2011-03-27 ENCOUNTER — Encounter (HOSPITAL_COMMUNITY): Payer: Self-pay

## 2011-03-27 ENCOUNTER — Inpatient Hospital Stay (HOSPITAL_COMMUNITY): Payer: Medicaid Other

## 2011-03-27 DIAGNOSIS — Z01818 Encounter for other preprocedural examination: Secondary | ICD-10-CM

## 2011-03-27 DIAGNOSIS — Z01812 Encounter for preprocedural laboratory examination: Secondary | ICD-10-CM

## 2011-03-27 DIAGNOSIS — O34219 Maternal care for unspecified type scar from previous cesarean delivery: Principal | ICD-10-CM | POA: Diagnosis present

## 2011-03-27 SURGERY — Surgical Case
Anesthesia: Spinal | Site: Abdomen | Wound class: Clean Contaminated

## 2011-03-27 MED ORDER — SCOPOLAMINE 1 MG/3DAYS TD PT72
1.0000 | MEDICATED_PATCH | TRANSDERMAL | Status: DC
Start: 2011-03-27 — End: 2011-03-27
  Administered 2011-03-27: 1.5 mg via TRANSDERMAL

## 2011-03-27 MED ORDER — ZOLPIDEM TARTRATE 5 MG PO TABS: 5.0000 mg | ORAL_TABLET | Freq: Every evening | ORAL | Status: DC | PRN

## 2011-03-27 MED ORDER — SODIUM CHLORIDE 0.9 % IV SOLN: 1.0000 ug/kg/h | INTRAVENOUS | Status: DC | PRN

## 2011-03-27 MED ORDER — EPHEDRINE SULFATE 50 MG/ML IJ SOLN
INTRAMUSCULAR | Status: DC | PRN
  Administered 2011-03-27: 20 mg via INTRAVENOUS
  Administered 2011-03-27: 10 mg via INTRAVENOUS

## 2011-03-27 MED ORDER — DIBUCAINE 1 % RE OINT: 1.0000 "application " | TOPICAL_OINTMENT | RECTAL | Status: DC | PRN

## 2011-03-27 MED ORDER — 0.9 % SODIUM CHLORIDE (POUR BTL) OPTIME
TOPICAL | Status: DC | PRN
  Administered 2011-03-27: 1000 mL

## 2011-03-27 MED ORDER — LANOLIN HYDROUS EX OINT: 1.0000 "application " | TOPICAL_OINTMENT | CUTANEOUS | Status: DC | PRN

## 2011-03-27 MED ORDER — IBUPROFEN 600 MG PO TABS
600.0000 mg | ORAL_TABLET | Freq: Four times a day (QID) | ORAL | Status: DC | PRN
  Filled 2011-03-27 (×9): qty 1

## 2011-03-27 MED ORDER — OXYTOCIN 10 UNIT/ML IJ SOLN
INTRAMUSCULAR | Status: AC
  Filled 2011-03-27: qty 2

## 2011-03-27 MED ORDER — SIMETHICONE 80 MG PO CHEW
80.0000 mg | CHEWABLE_TABLET | Freq: Three times a day (TID) | ORAL | Status: DC
  Administered 2011-03-27 – 2011-03-30 (×8): 80 mg via ORAL

## 2011-03-27 MED ORDER — SCOPOLAMINE 1 MG/3DAYS TD PT72: 1.0000 | MEDICATED_PATCH | Freq: Once | TRANSDERMAL | Status: DC

## 2011-03-27 MED ORDER — KETOROLAC TROMETHAMINE 30 MG/ML IJ SOLN: 30.0000 mg | Freq: Four times a day (QID) | INTRAMUSCULAR | Status: AC | PRN

## 2011-03-27 MED ORDER — FENTANYL CITRATE 0.05 MG/ML IJ SOLN
25.0000 ug | INTRAMUSCULAR | Status: DC | PRN
  Administered 2011-03-27 (×3): 50 ug via INTRAVENOUS

## 2011-03-27 MED ORDER — EPHEDRINE 5 MG/ML INJ
INTRAVENOUS | Status: AC
  Filled 2011-03-27: qty 10

## 2011-03-27 MED ORDER — WITCH HAZEL-GLYCERIN EX PADS: 1.0000 "application " | MEDICATED_PAD | CUTANEOUS | Status: DC | PRN

## 2011-03-27 MED ORDER — DIPHENHYDRAMINE HCL 25 MG PO CAPS: 25.0000 mg | ORAL_CAPSULE | ORAL | Status: DC | PRN

## 2011-03-27 MED ORDER — ONDANSETRON HCL 4 MG/2ML IJ SOLN
INTRAMUSCULAR | Status: DC | PRN
  Administered 2011-03-27: 4 mg via INTRAVENOUS

## 2011-03-27 MED ORDER — ONDANSETRON HCL 4 MG/2ML IJ SOLN: 4.0000 mg | Freq: Three times a day (TID) | INTRAMUSCULAR | Status: DC | PRN

## 2011-03-27 MED ORDER — KETOROLAC TROMETHAMINE 30 MG/ML IJ SOLN
INTRAMUSCULAR | Status: AC
  Administered 2011-03-27: 30 mg
  Filled 2011-03-27: qty 1

## 2011-03-27 MED ORDER — TETANUS-DIPHTH-ACELL PERTUSSIS 5-2.5-18.5 LF-MCG/0.5 IM SUSP
0.5000 mL | Freq: Once | INTRAMUSCULAR | Status: AC
  Administered 2011-03-28: 0.5 mL via INTRAMUSCULAR

## 2011-03-27 MED ORDER — KETOROLAC TROMETHAMINE 60 MG/2ML IM SOLN
60.0000 mg | Freq: Once | INTRAMUSCULAR | Status: AC | PRN
  Filled 2011-03-27: qty 2

## 2011-03-27 MED ORDER — DEXTROSE 5 % IV SOLN
2.0000 g | INTRAVENOUS | Status: AC
  Administered 2011-03-27: 2 g via INTRAVENOUS
  Filled 2011-03-27: qty 2

## 2011-03-27 MED ORDER — OXYCODONE-ACETAMINOPHEN 5-325 MG PO TABS
1.0000 | ORAL_TABLET | ORAL | Status: DC | PRN
  Administered 2011-03-28: 1 via ORAL
  Administered 2011-03-28 – 2011-03-30 (×7): 2 via ORAL
  Administered 2011-03-30: 1 via ORAL
  Filled 2011-03-27 (×2): qty 1
  Filled 2011-03-27 (×7): qty 2

## 2011-03-27 MED ORDER — PHENYLEPHRINE 40 MCG/ML (10ML) SYRINGE FOR IV PUSH (FOR BLOOD PRESSURE SUPPORT)
PREFILLED_SYRINGE | INTRAVENOUS | Status: AC
  Filled 2011-03-27: qty 5

## 2011-03-27 MED ORDER — LACTATED RINGERS IV SOLN
INTRAVENOUS | Status: DC
Start: 2011-03-27 — End: 2011-03-30
  Administered 2011-03-27 – 2011-03-28 (×2): via INTRAVENOUS

## 2011-03-27 MED ORDER — MENTHOL 3 MG MT LOZG: 1.0000 | LOZENGE | OROMUCOSAL | Status: DC | PRN

## 2011-03-27 MED ORDER — PRENATAL MULTIVITAMIN CH
1.0000 | ORAL_TABLET | Freq: Every day | ORAL | Status: DC
  Administered 2011-03-28 – 2011-03-30 (×3): 1 via ORAL
  Filled 2011-03-27 (×3): qty 1

## 2011-03-27 MED ORDER — SENNOSIDES-DOCUSATE SODIUM 8.6-50 MG PO TABS
2.0000 | ORAL_TABLET | Freq: Every day | ORAL | Status: DC
  Administered 2011-03-27 – 2011-03-28 (×2): 2 via ORAL

## 2011-03-27 MED ORDER — SCOPOLAMINE 1 MG/3DAYS TD PT72
MEDICATED_PATCH | TRANSDERMAL | Status: AC
  Administered 2011-03-27: 1.5 mg via TRANSDERMAL
  Filled 2011-03-27: qty 1

## 2011-03-27 MED ORDER — DIPHENHYDRAMINE HCL 50 MG/ML IJ SOLN: 12.5000 mg | INTRAMUSCULAR | Status: DC | PRN

## 2011-03-27 MED ORDER — ONDANSETRON HCL 4 MG/2ML IJ SOLN: 4.0000 mg | INTRAMUSCULAR | Status: DC | PRN

## 2011-03-27 MED ORDER — SODIUM CHLORIDE 0.9 % IJ SOLN: 3.0000 mL | INTRAMUSCULAR | Status: DC | PRN

## 2011-03-27 MED ORDER — NALOXONE HCL 0.4 MG/ML IJ SOLN: 0.4000 mg | INTRAMUSCULAR | Status: DC | PRN

## 2011-03-27 MED ORDER — PHENYLEPHRINE HCL 10 MG/ML IJ SOLN
INTRAMUSCULAR | Status: DC | PRN
  Administered 2011-03-27 (×3): 80 ug via INTRAVENOUS

## 2011-03-27 MED ORDER — FENTANYL CITRATE 0.05 MG/ML IJ SOLN
INTRAMUSCULAR | Status: AC
  Filled 2011-03-27: qty 2

## 2011-03-27 MED ORDER — OXYTOCIN 20 UNITS IN LACTATED RINGERS INFUSION - SIMPLE: 125.0000 mL/h | INTRAVENOUS | Status: AC

## 2011-03-27 MED ORDER — ONDANSETRON HCL 4 MG/2ML IJ SOLN
INTRAMUSCULAR | Status: AC
  Filled 2011-03-27: qty 2

## 2011-03-27 MED ORDER — ONDANSETRON HCL 4 MG PO TABS: 4.0000 mg | ORAL_TABLET | ORAL | Status: DC | PRN

## 2011-03-27 MED ORDER — IBUPROFEN 600 MG PO TABS
600.0000 mg | ORAL_TABLET | Freq: Four times a day (QID) | ORAL | Status: DC
  Administered 2011-03-27 – 2011-03-30 (×10): 600 mg via ORAL
  Filled 2011-03-27: qty 1

## 2011-03-27 MED ORDER — FENTANYL CITRATE 0.05 MG/ML IJ SOLN
INTRAMUSCULAR | Status: AC
  Administered 2011-03-27: 50 ug via INTRAVENOUS
  Filled 2011-03-27: qty 2

## 2011-03-27 MED ORDER — METOCLOPRAMIDE HCL 5 MG/ML IJ SOLN: 10.0000 mg | Freq: Three times a day (TID) | INTRAMUSCULAR | Status: DC | PRN

## 2011-03-27 MED ORDER — MEPERIDINE HCL 25 MG/ML IJ SOLN: 6.2500 mg | INTRAMUSCULAR | Status: DC | PRN

## 2011-03-27 MED ORDER — NALBUPHINE HCL 10 MG/ML IJ SOLN
5.0000 mg | INTRAMUSCULAR | Status: DC | PRN
  Filled 2011-03-27: qty 1

## 2011-03-27 MED ORDER — LACTATED RINGERS IV SOLN
INTRAVENOUS | Status: DC
  Administered 2011-03-27 (×5): via INTRAVENOUS

## 2011-03-27 MED ORDER — MORPHINE SULFATE (PF) 0.5 MG/ML IJ SOLN
INTRAMUSCULAR | Status: DC | PRN
  Administered 2011-03-27: .15 mg via INTRATHECAL

## 2011-03-27 MED ORDER — SIMETHICONE 80 MG PO CHEW: 80.0000 mg | CHEWABLE_TABLET | ORAL | Status: DC | PRN

## 2011-03-27 MED ORDER — MORPHINE SULFATE 0.5 MG/ML IJ SOLN
INTRAMUSCULAR | Status: AC
  Filled 2011-03-27: qty 10

## 2011-03-27 MED ORDER — DIPHENHYDRAMINE HCL 50 MG/ML IJ SOLN: 25.0000 mg | INTRAMUSCULAR | Status: DC | PRN

## 2011-03-27 MED ORDER — DIPHENHYDRAMINE HCL 25 MG PO CAPS: 25.0000 mg | ORAL_CAPSULE | Freq: Four times a day (QID) | ORAL | Status: DC | PRN

## 2011-03-27 MED ORDER — OXYTOCIN 20 UNITS IN LACTATED RINGERS INFUSION - SIMPLE
INTRAVENOUS | Status: DC | PRN
  Administered 2011-03-27 (×2): 20 [IU] via INTRAVENOUS

## 2011-03-27 MED ORDER — FENTANYL CITRATE 0.05 MG/ML IJ SOLN
INTRAMUSCULAR | Status: DC | PRN
  Administered 2011-03-27 (×3): 25 ug via INTRAVENOUS
  Administered 2011-03-27: 25 ug via INTRATHECAL

## 2011-03-27 SURGICAL SUPPLY — 34 items
CLOTH BEACON ORANGE TIMEOUT ST (SAFETY) ×2 IMPLANT
DERMABOND ADVANCED (GAUZE/BANDAGES/DRESSINGS) ×1
DERMABOND ADVANCED .7 DNX12 (GAUZE/BANDAGES/DRESSINGS) ×1 IMPLANT
DRESSING TELFA 8X3 (GAUZE/BANDAGES/DRESSINGS) ×2 IMPLANT
ELECT REM PT RETURN 9FT ADLT (ELECTROSURGICAL) ×2
ELECTRODE REM PT RTRN 9FT ADLT (ELECTROSURGICAL) ×1 IMPLANT
EXTRACTOR VACUUM M CUP 4 TUBE (SUCTIONS) IMPLANT
GAUZE SPONGE 4X4 12PLY STRL LF (GAUZE/BANDAGES/DRESSINGS) ×4 IMPLANT
GLOVE BIO SURGEON STRL SZ7 (GLOVE) ×4 IMPLANT
GLOVE BIOGEL PI IND STRL 8 (GLOVE) ×1 IMPLANT
GLOVE BIOGEL PI INDICATOR 8 (GLOVE) ×1
GOWN PREVENTION PLUS LG XLONG (DISPOSABLE) ×4 IMPLANT
KIT ABG SYR 3ML LUER SLIP (SYRINGE) IMPLANT
NEEDLE HYPO 25X5/8 SAFETYGLIDE (NEEDLE) IMPLANT
NS IRRIG 1000ML POUR BTL (IV SOLUTION) ×2 IMPLANT
PACK C SECTION WH (CUSTOM PROCEDURE TRAY) ×2 IMPLANT
PAD ABD 7.5X8 STRL (GAUZE/BANDAGES/DRESSINGS) ×2 IMPLANT
RETRACTOR WND ALEXIS 25 LRG (MISCELLANEOUS) ×1 IMPLANT
RTRCTR C-SECT PINK 25CM LRG (MISCELLANEOUS) IMPLANT
RTRCTR C-SECT PINK 34CM XLRG (MISCELLANEOUS) IMPLANT
RTRCTR WOUND ALEXIS 25CM LRG (MISCELLANEOUS) ×2
SLEEVE SCD COMPRESS KNEE MED (MISCELLANEOUS) IMPLANT
SPONGE LAP 18X18 X RAY DECT (DISPOSABLE) ×2 IMPLANT
STAPLER VISISTAT 35W (STAPLE) IMPLANT
SUT CHROMIC 1 CTX 36 (SUTURE) ×4 IMPLANT
SUT CHROMIC 2 0 CT 1 (SUTURE) ×2 IMPLANT
SUT PDS AB 0 CTX 60 (SUTURE) ×2 IMPLANT
SUT PLAIN 2 0 XLH (SUTURE) ×2 IMPLANT
SUT VIC AB 2-0 CT1 27 (SUTURE) ×1
SUT VIC AB 2-0 CT1 TAPERPNT 27 (SUTURE) ×1 IMPLANT
SUT VIC AB 4-0 KS 27 (SUTURE) ×2 IMPLANT
TOWEL OR 17X24 6PK STRL BLUE (TOWEL DISPOSABLE) ×4 IMPLANT
TRAY FOLEY CATH 14FR (SET/KITS/TRAYS/PACK) ×2 IMPLANT
WATER STERILE IRR 1000ML POUR (IV SOLUTION) ×2 IMPLANT

## 2011-03-27 NOTE — Op Note (Signed)
Pre-Operative Diagnosis: 1) 39+6 week IUP 2) H/O prior c/s desires repeat Postoperative Diagnosis: Same Procedure: Repeat LTCS Surgeon: Dr. Waynard Reeds Assistant: Dr. Carrington Clamp Operative Findings: Vigorous female infant in vertex presentation weighing with apgars 9/9.  Normal appearing ovaries and tubes.  The prior scar on the patients uterus appeared mid-transverse. Specimen: Placenta for disposal EBL: 600cc  Procedure:Ms. Rathmann is an 31 year old gravida 3 para 1101 at 53 weeks and 6 days estimated gestational age who presents for cesarean section. The patients prior pregnancy was complicated by IUGR and reverse end-diastolic flow necessitating cesarean delivery @ 26 weeks.  The infant died at 39 days of life due to primary CMV infection. The patient also has a h/o term SVD with her first pregnancy.  The patient had desired a TOL if she labored prior to term but if she remained pregnant on her due date she wanted to proceed with cesarean. Following the appropriate informed consent the patient was brought to the operating room where spinal anesthesia was administered and found to be adequate. She was placed in the dorsal supine position with a leftward tilt. She was prepped and draped in the normal sterile fashion. Scalpel was then used to make a Pfannenstiel skin incision which was carried down to the underlying layers of soft tissue to the fascia. The fascia was incised in the midline and the fascial incision was extended laterally with Mayo scissors. The superior aspect of the fascial incision was grasped with Coker clamps x2, tented up and the rectus muscles dissected off sharply with the electrocautery unit and the same procedure was repeated on the inferior aspect of the fascial incision. The rectus muscles were separated in the midline. The abdominal peritoneum was identified, tented up, entered sharply, and the incision was extended superiorly and inferiorly with good visualization of the  bladder. The a Alexis retractor was then deployed. The vesicouterine peritoneum was identified, tented up, entered sharply, and the bladder flap was created digitally. Scalpel was then used to make a low transverse incision on the uterus which was extended laterally with both blunt dissection and the bandage scissors. The fetal vertex was identified, delivered easily through the uterine incision followed by the body. The infant was bulb suctioned on the operative field cried vigorously, corpus length and cut and the infant was passed to the waiting neonatologist. Placenta was then delivered spontaneously, the uterus was cleared of all clot and debris. The uterine incision was repaired with #1 chromic in running locked fashion followed by a second imbricating layer. Ovaries and tubes were inspected and normal. The Alexis retractor was removed. The uterus was returned to the abdominal cavity the abdominal cavity was cleared of all clot and debris. The abdominal peritoneum was reapproximated with 2-0 Vicryl in a running fashion, the rectus muscles was reapproximated with #1 chromic in a running fashion. The fascia was closed with a looped PDS in a running fashion. The subcutaneous layer was reapproximated with 2-0 plain gut with interrupted suture. The skin was closed with 3-0 vicryl in a subcuticular fashion and dermabond. All sponge lap and needle counts were correct x2. Patient tolerated the procedure well and recovered in stable condition following the procedure.

## 2011-03-27 NOTE — Anesthesia Preprocedure Evaluation (Signed)
Anesthesia Evaluation  Patient identified by MRN, date of birth, ID band Patient awake    Reviewed: Allergy & Precautions, H&P , NPO status , Patient's Chart, lab work & pertinent test results, reviewed documented beta blocker date and time   History of Anesthesia Complications Negative for: history of anesthetic complications  Airway Mallampati: III TM Distance: >3 FB Neck ROM: full    Dental  (+)    Pulmonary neg pulmonary ROS,  clear to auscultation        Cardiovascular neg cardio ROS regular Normal    Neuro/Psych Negative Neurological ROS  Negative Psych ROS   GI/Hepatic Neg liver ROS, GERD- (worse with pregnancy, rolaids prn)  ,  Endo/Other  Morbid obesity  Renal/GU negative Renal ROS  Genitourinary negative   Musculoskeletal   Abdominal   Peds  Hematology negative hematology ROS (+) hgb 10.3   Anesthesia Other Findings   Reproductive/Obstetrics (+) Pregnancy (one prior c/s under SAB)                           Anesthesia Physical Anesthesia Plan  ASA: II  Anesthesia Plan: Spinal   Post-op Pain Management:    Induction:   Airway Management Planned:   Additional Equipment:   Intra-op Plan:   Post-operative Plan:   Informed Consent: I have reviewed the patients History and Physical, chart, labs and discussed the procedure including the risks, benefits and alternatives for the proposed anesthesia with the patient or authorized representative who has indicated his/her understanding and acceptance.   Dental Advisory Given  Plan Discussed with: Surgeon and CRNA  Anesthesia Plan Comments:         Anesthesia Quick Evaluation

## 2011-03-27 NOTE — Interval H&P Note (Signed)
History and Physical Interval Note:  03/27/2011 1:02 PM  Stefanie Jimenez  has presented today for surgery, with the diagnosis of REPEAT  The various methods of treatment have been discussed with the patient and family. After consideration of risks, benefits and other options for treatment, the patient has consented to  Procedure(s) (LRB): CESAREAN SECTION (N/A) as a surgical intervention .  The patients' history has been reviewed, patient examined, no change in status, stable for surgery.  I have reviewed the patients' chart and labs.  Questions were answered to the patient's satisfaction.     [name]

## 2011-03-27 NOTE — Transfer of Care (Signed)
Immediate Anesthesia Transfer of Care Note  Patient: Stefanie Jimenez  Procedure(s) Performed: Procedure(s) (LRB): CESAREAN SECTION (N/A)  Patient Location: PACU  Anesthesia Type: Spinal  Level of Consciousness: awake, alert  and oriented  Airway & Oxygen Therapy: Patient Spontanous Breathing  Post-op Assessment: Report given to PACU RN and Post -op Vital signs reviewed and stable  Post vital signs: Reviewed and stable  Complications: No apparent anesthesia complications

## 2011-03-27 NOTE — Anesthesia Postprocedure Evaluation (Signed)
  Anesthesia Post-op Note  Patient: Stefanie Jimenez  Procedure(s) Performed: Procedure(s) (LRB): CESAREAN SECTION (N/A)  Patient is awake, responsive, moving her legs, and has signs of resolution of her numbness. Pain and nausea are reasonably well controlled. Vital signs are stable and clinically acceptable. Oxygen saturation is clinically acceptable. There are no apparent anesthetic complications at this time. Patient is ready for discharge.

## 2011-03-27 NOTE — Anesthesia Procedure Notes (Signed)

## 2011-03-28 ENCOUNTER — Encounter (HOSPITAL_COMMUNITY): Payer: Self-pay | Admitting: Obstetrics and Gynecology

## 2011-03-28 LAB — CBC
HCT: 26.7 % — ABNORMAL LOW (ref 36.0–46.0)
MCHC: 32.2 g/dL (ref 30.0–36.0)
Platelets: 288 10*3/uL (ref 150–400)
RDW: 15.3 % (ref 11.5–15.5)
WBC: 12.4 10*3/uL — ABNORMAL HIGH (ref 4.0–10.5)

## 2011-03-28 NOTE — Progress Notes (Signed)
Patient ID: Stefanie Jimenez, female   DOB: February 02, 1980, 31 y.o.   MRN: [id number]  POD#1 S/P Repeat Cesarean Section  S: Doing well, pain controlled, eating well, voiding well since catheter d/cd O:  Filed Vitals:   03/27/11 2347 03/28/11 0155 03/28/11 0339 03/28/11 0545  BP: 100/68 94/61 91/59  88/66  Pulse: 91 86 84 80  Temp:  98.2 F (36.8 C) 97.8 F (36.6 C) 98 F (36.7 C)  TempSrc:  Oral Oral Oral  Resp:  20 20 20   Weight:      SpO2:  99% 99% 99%   AOX3, NAD abd soft, appropriately tender, ND Inc C/D/I LE SCDs in place  CBC    Component Value Date/Time   WBC 12.4* 03/28/2011 0515   RBC 2.99* 03/28/2011 0515   HGB 8.6* 03/28/2011 0515   HCT 26.7* 03/28/2011 0515   PLT 288 03/28/2011 0515   MCV 89.3 03/28/2011 0515   MCH 28.8 03/28/2011 0515   MCHC 32.2 03/28/2011 0515   RDW 15.3 03/28/2011 0515   LYMPHSABS 3.8 08/17/2008 1900   MONOABS 0.4 08/17/2008 1900   EOSABS 0.1 08/17/2008 1900   BASOSABS 0.1 08/17/2008 1900   A/P 1) Rourine PO care

## 2011-03-28 NOTE — Anesthesia Postprocedure Evaluation (Signed)
  Anesthesia Post-op Note  Patient: Stefanie Jimenez  Procedure(s) Performed: Procedure(s) (LRB): CESAREAN SECTION (N/A)  Patient Location: Mother/Baby  Anesthesia Type: Spinal  Level of Consciousness: awake, alert  and oriented  Airway and Oxygen Therapy: Patient Spontanous Breathing  Post-op Pain: none  Post-op Assessment: Post-op Vital signs reviewed, Patient's Cardiovascular Status Stable, No headache, No backache, No residual numbness and No residual motor weakness  Post-op Vital Signs: Reviewed and stable  Complications: No apparent anesthesia complications

## 2011-03-28 NOTE — Addendum Note (Signed)
Addendum  created 03/28/11 0818 by Madison Hickman, CRNA   Modules edited:Charges VN, Notes Section

## 2011-03-28 NOTE — Progress Notes (Signed)
UR chart review completed.  

## 2011-03-29 NOTE — Progress Notes (Signed)
Subjective: Postpartum Day 2: Cesarean Delivery Patient reports tolerating PO, + flatus, + BM and no problems voiding.    Objective: Vital signs in last 24 hours: Filed Vitals:   03/28/11 0545 03/28/11 1410 03/28/11 2145 03/29/11 0639  BP: 88/66 104/70 101/68 109/75  Pulse: 80 100 82 94  Temp: 98 F (36.7 C) 98.6 F (37 C) 98.1 F (36.7 C) 97.9 F (36.6 C)  TempSrc: Oral Oral Oral Oral  Resp: 20 19 18 18   Weight:      SpO2: 99%       Physical Exam:  General: alert, cooperative and appears stated age 31: appropriate Uterine Fundus: firm Incision: healing well   Basename 03/28/11 0515 03/26/11 1351  HGB 8.6* 10.3*  HCT 26.7* 32.8*    Assessment/Plan: Status post Cesarean section. Doing well postoperatively.  Continue current care.  Emmalie Haigh H. 03/29/2011, 9:35 AM

## 2011-03-30 MED ORDER — OXYCODONE-ACETAMINOPHEN 5-325 MG PO TABS: 2.0000 | ORAL_TABLET | ORAL | Status: AC | PRN

## 2011-03-30 MED ORDER — IBUPROFEN 600 MG PO TABS: 600.0000 mg | ORAL_TABLET | Freq: Four times a day (QID) | ORAL | Status: AC | PRN

## 2011-03-30 NOTE — Discharge Instructions (Signed)
Cesarean Delivery  °Cesarean delivery is the birth of a baby through a cut (incision) in the abdomen and womb (uterus).  °LET YOUR CAREGIVER KNOW ABOUT: °· Complications involving the pregnancy.  °· Allergies.  °· Medicines taken including herbs, eyedrops, over-the-counter medicines, and creams.  °· Use of steroids (by mouth or creams).  °· Previous problems with anesthetics or numbing medicine.  °· Previous surgery.  °· History of blood clots.  °· History of bleeding or blood problems.  °· Other health problems.  °RISKS AND COMPLICATIONS  °· Bleeding.  °· Infection.  °· Blood clots.  °· Injury to surrounding organs.  °· Anesthesia problems.  °· Injury to the baby.  °BEFORE THE PROCEDURE  °· A tube (Foley catheter) will be placed in your bladder. The Foley catheter drains the urine from your bladder into a bag. This keeps your bladder empty during surgery.  °· An intravenous access tube (IV) will be placed in your arm.  °· Hair may be removed from your pubic area and your lower abdomen. This is to prevent infection in the incision site.  °· You may be given an antacid medicine to drink. This will prevent acid contents in your stomach from going into your lungs if you vomit during the surgery.  °· You may be given an antibiotic medicine to prevent infection.  °PROCEDURE  °· You may be given medicine to numb the lower half of your body (regional anesthetic). If you were in labor, you may have already had an epidural in place which can be used in both labor and cesarean delivery. You may possibly be given medicine to make you sleep (general anesthetic) though this is not as common.  °· An incision will be made in your abdomen that extends to your uterus. There are 2 basic kinds of incisions:  °· The horizontal (transverse) incision. Horizontal incisions are used for most routine cesarean deliveries.  °· The vertical (up and down) incision. This is less commonly used. This is most often reserved for women who have a  serious complication (extreme prematurity) or under emergency situations.   °· The horizontal and vertical incisions may both be used at the same time. However, this is very uncommon.  °· Your baby will then be delivered.  °AFTER THE PROCEDURE  °· If you were awake during the surgery, you will see your baby right away. If you were asleep, you will see your baby as soon as you are awake.  °· You may breastfeed your baby after surgery.  °· You may be able to get up and walk the same day as the surgery. If you need to stay in bed for a period of time, you will receive help to turn, cough, and take deep breaths after surgery. This helps prevent lung problems such as pneumonia.  °· Do not get out of bed alone the first time after surgery. You will need help getting out of bed until you are able to do this by yourself.  °· You may be able to shower the day after your cesarean delivery.  After the bandage (dressing) is taken off the incision site, a nurse will assist you to shower, if you like.   °· You will have pneumatic compressing hose placed on your feet or lower legs. These hose are used to prevent blood clots. When you are up and walking regularly, they will no longer be necessary.   °· Do not cross your legs when you sit.  °· Save any blood clots that you   pass. If you pass a clot while on the toilet, do not flush it. Call for the nurse. Tell the nurse if you think you are bleeding too much or passing too many clots.  °· Start drinking liquids and eating food as directed by your caregiver. If your stomach is not ready, drinking and eating too soon can cause an increase in bloating and swelling of your intestine and abdomen. This is very uncomfortable.  °· You will be given medicine as needed. Let your caregivers know if you are hurting. They want you to be comfortable. You may also be given an antibiotic to prevent an infection.  °· Your IV will be taken out when you are drinking a reasonable amount of fluids. The  Foley catheter is taken out when you are up and walking.  °· If your blood type is Rh negative and your baby's blood type is Rh positive, you will be given a shot of anti-D immune globulin. This shot prevents you from having Rh problems with a future pregnancy. You should get the shot even if you had your tubes tied (tubal ligation).  °· If you are allowed to take the baby for a walk, place the baby in the bassinet and push it. Do not carry your baby in your arms.  °Document Released: 01/13/2005 Document Revised: 01/02/2011 Document Reviewed: 05/10/2010 °[employer]® Patient Information ©2012 [employer]. °

## 2011-03-30 NOTE — Discharge Summary (Signed)
Obstetric Discharge Summary Reason for Admission: onset of labor and cesarean section Prenatal Procedures: NST and ultrasound Intrapartum Procedures: cesarean: low cervical, transverse Postpartum Procedures: none Complications-Operative and Postpartum: none Hemoglobin  Date Value Range Status  03/28/2011 8.6* 12.0-15.0 (g/dL) Final     HCT  Date Value Range Status  03/28/2011 26.7* 36.0-46.0 (%) Final    Discharge Diagnoses: Term Pregnancy-delivered  Discharge Information: Date: 03/30/2011 Activity: unrestricted Diet: routine Medications: PNV, Ibuprofen and Percocet Condition: stable Instructions: refer to practice specific booklet Discharge to: home Follow-up Information    Follow up with Almon Hercules., MD in 4 weeks. (for postpartum evaluation)    Contact information:   [address] [place] [address] [phone]          Newborn Data: Live born female  Birth Weight: 6 lb 3.8 oz (2830 g) APGAR: 9, 9  Home with mother.  Jlynn Langille H. 03/30/2011, 11:04 AM

## 2013-11-28 ENCOUNTER — Encounter (HOSPITAL_COMMUNITY): Payer: Self-pay | Admitting: Obstetrics and Gynecology

## 2014-03-17 ENCOUNTER — Other Ambulatory Visit: Payer: Self-pay | Admitting: Obstetrics and Gynecology

## 2014-03-20 LAB — CYTOLOGY - PAP

## 2015-07-11 ENCOUNTER — Encounter (HOSPITAL_COMMUNITY): Payer: Self-pay | Admitting: Emergency Medicine

## 2015-07-11 ENCOUNTER — Ambulatory Visit (HOSPITAL_COMMUNITY)
Admission: EM | Admit: 2015-07-11 | Discharge: 2015-07-11 | Disposition: A | Discharge status: Home / Self Care | Payer: 59 | Attending: Emergency Medicine | Admitting: Emergency Medicine

## 2015-07-11 DIAGNOSIS — N390 Urinary tract infection, site not specified: Secondary | ICD-10-CM | POA: Diagnosis not present

## 2015-07-11 DIAGNOSIS — N898 Other specified noninflammatory disorders of vagina: Secondary | ICD-10-CM

## 2015-07-11 LAB — POCT URINALYSIS DIP (DEVICE)
Glucose, UA: NEGATIVE mg/dL
Ketones, ur: NEGATIVE mg/dL
NITRITE: NEGATIVE
PH: 6 (ref 5.0–8.0)
PROTEIN: 30 mg/dL — AB
SPECIFIC GRAVITY, URINE: 1.025 (ref 1.005–1.030)
UROBILINOGEN UA: 1 mg/dL (ref 0.0–1.0)

## 2015-07-11 MED ORDER — CEPHALEXIN 500 MG PO CAPS: 500.0000 mg | ORAL_CAPSULE | Freq: Two times a day (BID) | ORAL | Status: DC

## 2015-07-11 NOTE — ED Provider Notes (Signed)
CSN: [id number]     Arrival date & time 07/11/15  1744 History   First MD Initiated Contact with Patient 07/11/15 1914     Chief Complaint  Patient presents with  . Vaginal Discharge   (Consider location/radiation/quality/duration/timing/severity/associated sxs/prior Treatment) HPI  Stefanie Jimenez is a 35 y.o. female presenting to UC with c/o mild vaginal discharge and itching for 4-5 days.  She has used monistat with minimal relief but has hx of trichomonas and had similar symptoms at that time. Pt also reports hx of herpes but denies any pain or sores with the discharge. Denies abdominal pain, n/v/d, back pain or dysuria.  She notes she had has intercourse within the last 3 weeks but notes it is her only partner.  She cannot confirm if he has been with other people.  She does have a mirana.      Past Medical History  Diagnosis Date  . Anemia   . Bursitis of shoulder, left   . Fibroid   . Abnormal Pap smear 2006    herpes,never had outbreak  . Headache(784.0)   . GERD (gastroesophageal reflux disease)     no meds, worsens with pregnancy   Past Surgical History  Procedure Laterality Date  . Cesarean section    . Cesarean section  03/27/2011    Procedure: CESAREAN SECTION;  Surgeon: Farrel Gobble. Harrington Challenger, MD;  Location: [facility];  Service: Gynecology;  Laterality: N/A;   No family history on file. Social History  Substance Use Topics  . Smoking status: Never Smoker   . Smokeless tobacco: None  . Alcohol Use: Yes     Comment: occasional   OB History    Gravida Para Term Preterm AB TAB SAB Ectopic Multiple Living   3 3 2 1  0 0 0 0 0 2     Review of Systems  Constitutional: Negative for fever and chills.  Gastrointestinal: Negative for nausea, vomiting, abdominal pain and diarrhea.  Genitourinary: Positive for vaginal discharge and vaginal pain ( discomfort, itching). Negative for dysuria, urgency, frequency, hematuria, flank pain and pelvic pain.  Musculoskeletal: Negative for  myalgias and back pain.    Allergies  Review of patient's allergies indicates no known allergies.  Home Medications   Prior to Admission medications   Medication Sig Start Date End Date Taking? Authorizing Provider  cephALEXin (KEFLEX) 500 MG capsule Take 1 capsule (500 mg total) by mouth 2 (two) times daily. For 7 days 07/11/15   Noland Fordyce, PA-C  Prenatal Vit-Fe Fumarate-FA (PRENATAL MULTIVITAMIN) TABS Take 1 tablet by mouth every morning.    Historical Provider, MD   Meds Ordered and Administered this Visit  Medications - No data to display  BP 115/78 mmHg  Pulse 84  Temp(Src) 98.3 F (36.8 C) (Oral)  Resp 16  Ht 5\' 4"  (1.626 m)  Wt 253 lb (114.76 kg)  BMI 43.41 kg/m2  SpO2 98%  LMP 06/29/2015 No data found.   Physical Exam  Constitutional: She appears well-developed and well-nourished. No distress.  HENT:  Head: Normocephalic and atraumatic.  Eyes: Conjunctivae are normal. No scleral icterus.  Neck: Normal range of motion.  Cardiovascular: Normal rate, regular rhythm and normal heart sounds.   Pulmonary/Chest: Effort normal and breath sounds normal. No respiratory distress. She has no wheezes. She has no rales. She exhibits no tenderness.  Abdominal: Soft. Bowel sounds are normal. She exhibits no distension and no mass. There is no tenderness. There is no rebound, no guarding and no CVA tenderness.  Genitourinary:  Chaperoned exam, normal external genitalia. Vaginal canal- moderate amount of gray-white discharge.  No bleeding. No CMT, adnexal tenderness or masses.  Musculoskeletal: Normal range of motion.  Neurological: She is alert.  Skin: Skin is warm and dry. She is not diaphoretic.  Nursing note and vitals reviewed.   ED Course  Procedures (including critical care time)  Labs Review Labs Reviewed  POCT URINALYSIS DIP (DEVICE) - Abnormal; Notable for the following:    Bilirubin Urine SMALL (*)    Hgb urine dipstick MODERATE (*)    Protein, ur 30 (*)     Leukocytes, UA LARGE (*)    All other components within normal limits  URINE CULTURE  CERVICOVAGINAL ANCILLARY ONLY    Imaging Review No results found.    MDM   1. Vaginal discharge   2. UTI (lower urinary tract infection)    Pt concerned for possible STD vs yeast infection. She wants to wait on results before treated.  UA c/w UTI, will send culture. Will treat with Keflex.  F/u with PCP for recheck of symptoms in 1 week if not improving, sooner if worsening. Patient verbalized understanding and agreement with treatment plan.     Noland Fordyce, PA-C 07/11/15 2027

## 2015-07-11 NOTE — ED Notes (Signed)
PT reports slight vaginal discharge and itching for 4-5 days. PT used monistat with little relief. PT had similar symptoms a year ago and it was trich. PT has been diagnosed with herpes in the past.

## 2015-07-12 LAB — CERVICOVAGINAL ANCILLARY ONLY
Chlamydia: NEGATIVE
Neisseria Gonorrhea: NEGATIVE
Wet Prep (BD Affirm): POSITIVE — AB

## 2015-07-13 LAB — URINE CULTURE

## 2015-07-16 ENCOUNTER — Telehealth (HOSPITAL_COMMUNITY): Payer: Self-pay | Admitting: Emergency Medicine

## 2015-07-16 NOTE — ED Notes (Signed)
LM on pt's VM [phone] Need to give lab results from recent visit on 6/14 Also let pt know labs can be obtained from MyChart Pt tested pos for Trich and BV Per VO from Dr. Alphonzo Cruise.... Can call in metronidazole 500 mg BID x7 days #14 and no refills.  Will wait for phone call from pt to get pharmacy.

## 2015-07-17 ENCOUNTER — Telehealth (HOSPITAL_COMMUNITY): Payer: Self-pay | Admitting: Emergency Medicine

## 2015-07-17 MED ORDER — METRONIDAZOLE 500 MG PO TABS: 500.0000 mg | ORAL_TABLET | Freq: Two times a day (BID) | ORAL | Status: DC

## 2015-07-17 NOTE — ED Notes (Signed)
Pt called back... Pt reports she saw her results on MyChart and would like for [place] to call in Rx to [facility] ([address])... Gave pt info on safe sex... Pt adv to notify partner(s)... Pt verb understanding.

## 2015-07-17 NOTE — ED Notes (Signed)
Pt called back... Pt reports she saw her results on MyChart and would like for [place] to call in Rx to [facility] ([address])... Gave pt info on safe sex... Pt adv to notify partner(s)... Pt verb understanding... Warning sign popped up for poss preg and lactating while e-Rx.... Pt confirmed she is not pregnant and is not breastfeeding... She has IUD placed

## 2018-07-23 ENCOUNTER — Ambulatory Visit (HOSPITAL_COMMUNITY)
Admission: EM | Admit: 2018-07-23 | Discharge: 2018-07-23 | Disposition: A | Discharge status: Home / Self Care | Payer: Self-pay | Attending: Emergency Medicine | Admitting: Emergency Medicine

## 2018-07-23 ENCOUNTER — Other Ambulatory Visit: Payer: Self-pay

## 2018-07-23 ENCOUNTER — Encounter (HOSPITAL_COMMUNITY): Payer: Self-pay

## 2018-07-23 DIAGNOSIS — R609 Edema, unspecified: Secondary | ICD-10-CM | POA: Insufficient documentation

## 2018-07-23 DIAGNOSIS — R5383 Other fatigue: Secondary | ICD-10-CM | POA: Insufficient documentation

## 2018-07-23 LAB — BASIC METABOLIC PANEL
Anion gap: 9 (ref 5–15)
BUN: 9 mg/dL (ref 6–20)
CO2: 24 mmol/L (ref 22–32)
Calcium: 9.1 mg/dL (ref 8.9–10.3)
Chloride: 103 mmol/L (ref 98–111)
Creatinine, Ser: 0.84 mg/dL (ref 0.44–1.00)
GFR calc Af Amer: >60 mL/min (ref >60)
GFR calc non Af Amer: >60 mL/min (ref >60)
Glucose, Bld: 86 mg/dL (ref 70–99)
Potassium: 4.1 mmol/L (ref 3.5–5.1)
Sodium: 136 mmol/L (ref 135–145)

## 2018-07-23 LAB — CBC
HCT: 38.3 % (ref 36.0–46.0)
Hemoglobin: 12 g/dL (ref 12.0–15.0)
MCH: 29 pg (ref 26.0–34.0)
MCHC: 31.3 g/dL (ref 30.0–36.0)
MCV: 92.5 fL (ref 80.0–100.0)
Platelets: UNDETERMINED 10*3/uL (ref 150–400)
RBC: 4.14 MIL/uL (ref 3.87–5.11)
RDW: 13.1 % (ref 11.5–15.5)
WBC: 8.3 10*3/uL (ref 4.0–10.5)
nRBC: 0 % (ref 0.0–0.2)

## 2018-07-23 LAB — TSH: TSH: 1.883 u[IU]/mL (ref 0.350–4.500)

## 2018-07-23 LAB — BRAIN NATRIURETIC PEPTIDE: B Natriuretic Peptide: 8.3 pg/mL (ref 0.0–100.0)

## 2018-07-23 NOTE — Discharge Instructions (Signed)
Will notify you of any concerning findings and if any changes to treatment are needed.   Increase your water intake.  Elevate legs as able.  Activity as tolerated.  Compression socks may be helpful.  Monitor salt in diet.  I have attached info about iron-rich diets for you to look at well.  I would encourage plenty of protein in diet especially prior to fasting to keep blood sugar stable.  Continue to follow with a PCP for any persistent symptoms.

## 2018-07-23 NOTE — ED Triage Notes (Signed)
Pt presents with swelling in both feet with no complaints of pain; pt states she is also experiencing fatigue X 2 days.

## 2018-07-23 NOTE — ED Provider Notes (Signed)
[facility]    CSN: [id number] Arrival date & time: 07/23/18  1321     History   Chief Complaint Chief Complaint  Patient presents with  . Foot Swelling  . Fatigue    HPI Stefanie Jimenez is a 38 y.o. female.   Stefanie Jimenez presents with complaints of  Bilateral lower extremity edema as well as fatigue. She has noticed this over the past few days. 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 Noted her foot was swollen this morning when she woke, L>R. States in her meeting at work two days ago she actually fell asleep even. Just recently started intermittent fasting to try to lose weight. Has been drinking water but hasn't noticed any increased urination. No pain to feet but skin feels tight from swelling. No cough, no shortness of breath. Has had similar in the past when she was pregnant. She has a mirena IUD in place. No calf pain or swelling. No recent travel. Hasn't taken any medications for symptoms and doesn't take any medications regularly. Doesn't have a PCP. States her mother was recently in the hospital related to blood clot so patient is concerned about this.    ROS per HPI, negative if not otherwise mentioned.      History reviewed. No pertinent past medical history.  There are no active problems to display for this patient.   Past Surgical History:  Procedure Laterality Date  . REPEAT CESAREAN SECTION      OB History   No obstetric history on file.      Home Medications    Prior to Admission medications   Not on File    Family History Family History  Family history unknown: Yes    Social History Social History   Tobacco Use  . Smoking status: Never Smoker  Substance Use Topics  . Alcohol use: Not on file  . Drug use: Not on file     Allergies   Patient has no known allergies.   Review of Systems Review of Systems   Physical Exam Triage Vital Signs ED Triage Vitals [07/23/18 1409]  Enc Vitals Group     BP  134/73     Pulse Rate 88     Resp 17     Temp 98.3 F (36.8 C)     Temp Source Oral     SpO2 98 %     Weight      Height      Head Circumference      Peak Flow      Pain Score 0     Pain Loc      Pain Edu?      Excl. in [place]?    No data found.  Updated Vital Signs BP 134/73 (BP Location: Left Arm)   Pulse 88   Temp 98.3 F (36.8 C) (Oral)   Resp 17   SpO2 98%   Visual Acuity Right Eye Distance:   Left Eye Distance:   Bilateral Distance:    Right Eye Near:   Left Eye Near:    Bilateral Near:     Physical Exam Constitutional:      General: She is not in acute distress.    Appearance: She is well-developed.  Cardiovascular:     Rate and Rhythm: Normal rate.  Pulmonary:     Effort: Pulmonary effort is normal.  Lymphadenopathy:     Comments: +1 non pitting edema to bilateral feet/ankles; gross sensation intact; full  ROM; strong pedal pulses; no redness or swelling to feet   Skin:    General: Skin is warm and dry.  Neurological:     Mental Status: She is alert and oriented to person, place, and time.      UC Treatments / Results  Labs (all labs ordered are listed, but only abnormal results are displayed) Labs Reviewed  CBC  BASIC METABOLIC PANEL  TSH  BRAIN NATRIURETIC PEPTIDE    EKG None  Radiology No results found.  Procedures Procedures (including critical care time)  Medications Ordered in UC Medications - No data to display  Initial Impression / Assessment and Plan / UC Course  I have reviewed the triage vital signs and the nursing notes.  Pertinent labs & imaging results that were available during my care of the patient were reviewed by me and considered in my medical decision making (see chart for details).     No dvt risk factors. mirena iud in place. Irregular periods related to iud, spotting a few weeks ago. She is sexually active. States she will take a home pregnancy test. New diet and has recently tried walking some too. Question  if this is source of fatigue and swelling as well. Diet and exercise discussed at length. Baseline labs obtained. Encouraged follow up with PCP for persistent symptoms. Patient verbalized understanding and agreeable to plan.   Final Clinical Impressions(s) / UC Diagnoses   Final diagnoses:  Fatigue, unspecified type  Peripheral edema     Discharge Instructions     Will notify you of any concerning findings and if any changes to treatment are needed.   Increase your water intake.  Elevate legs as able.  Activity as tolerated.  Compression socks may be helpful.  Monitor salt in diet.  I have attached info about iron-rich diets for you to look at well.  I would encourage plenty of protein in diet especially prior to fasting to keep blood sugar stable.  Continue to follow with a PCP for any persistent symptoms.     ED Prescriptions    None     Controlled Substance Prescriptions [place] Controlled Substance Registry consulted? Not Applicable   Zigmund Gottron, NP 07/23/18 1539

## 2018-07-26 ENCOUNTER — Telehealth (HOSPITAL_COMMUNITY): Payer: Self-pay | Admitting: Emergency Medicine

## 2018-07-26 NOTE — Telephone Encounter (Signed)
Patient contacted and made aware of all results, all questions answered.   

## 2018-07-27 ENCOUNTER — Encounter (HOSPITAL_COMMUNITY): Payer: Self-pay | Admitting: Emergency Medicine

## 2018-10-23 ENCOUNTER — Other Ambulatory Visit: Payer: Self-pay

## 2018-10-23 DIAGNOSIS — Z20822 Contact with and (suspected) exposure to covid-19: Secondary | ICD-10-CM

## 2018-10-24 LAB — NOVEL CORONAVIRUS, NAA: SARS-CoV-2, NAA: NOT DETECTED

## 2020-10-17 ENCOUNTER — Other Ambulatory Visit: Payer: Self-pay | Admitting: Obstetrics and Gynecology

## 2020-11-01 ENCOUNTER — Other Ambulatory Visit: Payer: Self-pay

## 2020-11-01 ENCOUNTER — Encounter (HOSPITAL_BASED_OUTPATIENT_CLINIC_OR_DEPARTMENT_OTHER): Payer: Self-pay | Admitting: Obstetrics and Gynecology

## 2020-11-01 NOTE — Progress Notes (Signed)
Spoke w/ via phone for pre-op interview---[name] needs dos---- none              Lab results------11/06/20 Lab appointment for type & screen, CBC, BMP, HCG COVID test -----patient states asymptomatic no test needed Arrive at ------0930- NPO after MN NO Solid Food.  Clear liquids from MN until---0830 Med rec completed Medications to take morning of surgery -----none Diabetic medication -----n/a Patient instructed no nail polish to be worn day of surgery Patient instructed to bring photo id and insurance card day of surgery Patient aware to have Driver (ride ) / caregiver    for 24 hours after surgery - son - Stefanie Jimenez Patient Special Instructions -----none Pre-Op special Istructions -----none Patient verbalized understanding of instructions that were given at this phone interview. Patient denies shortness of breath, chest pain, fever, cough at this phone interview.

## 2020-11-06 ENCOUNTER — Other Ambulatory Visit: Payer: Self-pay

## 2020-11-06 ENCOUNTER — Encounter (HOSPITAL_COMMUNITY)
Admission: RE | Admit: 2020-11-06 | Discharge: 2020-11-06 | Disposition: A | Discharge status: Home / Self Care | Payer: BC Managed Care – PPO | Source: Ambulatory Visit | Attending: Obstetrics and Gynecology | Admitting: Obstetrics and Gynecology

## 2020-11-06 DIAGNOSIS — Z30432 Encounter for removal of intrauterine contraceptive device: Secondary | ICD-10-CM | POA: Diagnosis not present

## 2020-11-06 DIAGNOSIS — Z01812 Encounter for preprocedural laboratory examination: Secondary | ICD-10-CM | POA: Insufficient documentation

## 2020-11-06 DIAGNOSIS — Z302 Encounter for sterilization: Secondary | ICD-10-CM | POA: Diagnosis not present

## 2020-11-06 LAB — CBC
HCT: 38.3 % (ref 36.0–46.0)
Hemoglobin: 11.8 g/dL — ABNORMAL LOW (ref 12.0–15.0)
MCH: 28.8 pg (ref 26.0–34.0)
MCHC: 30.8 g/dL (ref 30.0–36.0)
MCV: 93.4 fL (ref 80.0–100.0)
Platelets: 265 10*3/uL (ref 150–400)
RBC: 4.1 MIL/uL (ref 3.87–5.11)
RDW: 13.4 % (ref 11.5–15.5)
WBC: 8 10*3/uL (ref 4.0–10.5)
nRBC: 0 % (ref 0.0–0.2)

## 2020-11-06 LAB — BASIC METABOLIC PANEL
Anion gap: 8 (ref 5–15)
BUN: 12 mg/dL (ref 6–20)
CO2: 22 mmol/L (ref 22–32)
Calcium: 8.8 mg/dL — ABNORMAL LOW (ref 8.9–10.3)
Chloride: 106 mmol/L (ref 98–111)
Creatinine, Ser: 0.77 mg/dL (ref 0.44–1.00)
GFR, Estimated: >60 mL/min (ref >60)
Glucose, Bld: 94 mg/dL (ref 70–99)
Potassium: 4.2 mmol/L (ref 3.5–5.1)
Sodium: 136 mmol/L (ref 135–145)

## 2020-11-09 ENCOUNTER — Ambulatory Visit (HOSPITAL_BASED_OUTPATIENT_CLINIC_OR_DEPARTMENT_OTHER)
Admission: RE | Admit: 2020-11-09 | Discharge: 2020-11-09 | Disposition: A | Discharge status: Home / Self Care | Payer: BC Managed Care – PPO | Source: Ambulatory Visit | Attending: Obstetrics and Gynecology | Admitting: Obstetrics and Gynecology

## 2020-11-09 ENCOUNTER — Encounter (HOSPITAL_BASED_OUTPATIENT_CLINIC_OR_DEPARTMENT_OTHER)
Admission: RE | Disposition: A | Discharge status: Home / Self Care | Payer: Self-pay | Source: Ambulatory Visit | Attending: Obstetrics and Gynecology

## 2020-11-09 ENCOUNTER — Other Ambulatory Visit: Payer: Self-pay

## 2020-11-09 ENCOUNTER — Ambulatory Visit (HOSPITAL_BASED_OUTPATIENT_CLINIC_OR_DEPARTMENT_OTHER): Payer: BC Managed Care – PPO | Admitting: Anesthesiology

## 2020-11-09 ENCOUNTER — Encounter (HOSPITAL_BASED_OUTPATIENT_CLINIC_OR_DEPARTMENT_OTHER): Payer: Self-pay | Admitting: Obstetrics and Gynecology

## 2020-11-09 DIAGNOSIS — Z30432 Encounter for removal of intrauterine contraceptive device: Secondary | ICD-10-CM | POA: Insufficient documentation

## 2020-11-09 DIAGNOSIS — Z302 Encounter for sterilization: Secondary | ICD-10-CM | POA: Diagnosis not present

## 2020-11-09 HISTORY — PX: LAPAROSCOPIC TUBAL LIGATION: SHX1937

## 2020-11-09 LAB — TYPE AND SCREEN
ABO/RH(D): O POS
Antibody Screen: NEGATIVE

## 2020-11-09 LAB — POCT PREGNANCY, URINE: Preg Test, Ur: NEGATIVE

## 2020-11-09 SURGERY — LIGATION, FALLOPIAN TUBE, LAPAROSCOPIC
Anesthesia: General | Site: Abdomen | Laterality: Bilateral

## 2020-11-09 MED ORDER — IBUPROFEN 800 MG PO TABS: 800.0000 mg | ORAL_TABLET | Freq: Three times a day (TID) | ORAL | 0 refills | Status: AC | PRN

## 2020-11-09 MED ORDER — DEXAMETHASONE SODIUM PHOSPHATE 10 MG/ML IJ SOLN
INTRAMUSCULAR | Status: AC
  Filled 2020-11-09: qty 1

## 2020-11-09 MED ORDER — ONDANSETRON HCL 4 MG/2ML IJ SOLN
INTRAMUSCULAR | Status: DC | PRN
Start: 2020-11-09 — End: 2020-11-09
  Administered 2020-11-09: 4 mg via INTRAVENOUS

## 2020-11-09 MED ORDER — ONDANSETRON HCL 4 MG/2ML IJ SOLN
INTRAMUSCULAR | Status: AC
  Filled 2020-11-09: qty 2

## 2020-11-09 MED ORDER — KETOROLAC TROMETHAMINE 30 MG/ML IJ SOLN
INTRAMUSCULAR | Status: AC
  Filled 2020-11-09: qty 1

## 2020-11-09 MED ORDER — LIDOCAINE 2% (20 MG/ML) 5 ML SYRINGE
INTRAMUSCULAR | Status: AC
  Filled 2020-11-09: qty 5

## 2020-11-09 MED ORDER — HYDROCODONE-ACETAMINOPHEN 5-325 MG PO TABS: 1.0000 | ORAL_TABLET | Freq: Four times a day (QID) | ORAL | 0 refills | Status: AC | PRN

## 2020-11-09 MED ORDER — ROCURONIUM BROMIDE 10 MG/ML (PF) SYRINGE
PREFILLED_SYRINGE | INTRAVENOUS | Status: DC | PRN
  Administered 2020-11-09: 70 mg via INTRAVENOUS

## 2020-11-09 MED ORDER — LIDOCAINE 2% (20 MG/ML) 5 ML SYRINGE
INTRAMUSCULAR | Status: DC | PRN
  Administered 2020-11-09: 100 mg via INTRAVENOUS

## 2020-11-09 MED ORDER — ACETAMINOPHEN 500 MG PO TABS
ORAL_TABLET | ORAL | Status: AC
  Filled 2020-11-09: qty 1

## 2020-11-09 MED ORDER — FENTANYL CITRATE (PF) 250 MCG/5ML IJ SOLN
INTRAMUSCULAR | Status: AC
  Filled 2020-11-09: qty 5

## 2020-11-09 MED ORDER — LACTATED RINGERS IV SOLN: INTRAVENOUS | Status: DC

## 2020-11-09 MED ORDER — MIDAZOLAM HCL 2 MG/2ML IJ SOLN
INTRAMUSCULAR | Status: AC
  Filled 2020-11-09: qty 2

## 2020-11-09 MED ORDER — DEXMEDETOMIDINE (PRECEDEX) IN NS 20 MCG/5ML (4 MCG/ML) IV SYRINGE
PREFILLED_SYRINGE | INTRAVENOUS | Status: AC
  Filled 2020-11-09: qty 10

## 2020-11-09 MED ORDER — PROPOFOL 10 MG/ML IV BOLUS
INTRAVENOUS | Status: AC
  Filled 2020-11-09: qty 20

## 2020-11-09 MED ORDER — ACETAMINOPHEN 500 MG PO TABS
1000.0000 mg | ORAL_TABLET | Freq: Once | ORAL | Status: AC
  Administered 2020-11-09: 1000 mg via ORAL

## 2020-11-09 MED ORDER — FENTANYL CITRATE (PF) 100 MCG/2ML IJ SOLN: 25.0000 ug | INTRAMUSCULAR | Status: DC | PRN

## 2020-11-09 MED ORDER — ROCURONIUM BROMIDE 10 MG/ML (PF) SYRINGE
PREFILLED_SYRINGE | INTRAVENOUS | Status: AC
  Filled 2020-11-09: qty 10

## 2020-11-09 MED ORDER — SUGAMMADEX SODIUM 200 MG/2ML IV SOLN
INTRAVENOUS | Status: DC | PRN
  Administered 2020-11-09: 200 mg via INTRAVENOUS

## 2020-11-09 MED ORDER — PROPOFOL 10 MG/ML IV BOLUS
INTRAVENOUS | Status: DC | PRN
  Administered 2020-11-09: 160 mg via INTRAVENOUS

## 2020-11-09 MED ORDER — MIDAZOLAM HCL 5 MG/5ML IJ SOLN
INTRAMUSCULAR | Status: DC | PRN
  Administered 2020-11-09: 2 mg via INTRAVENOUS

## 2020-11-09 MED ORDER — BUPIVACAINE HCL (PF) 0.25 % IJ SOLN
INTRAMUSCULAR | Status: DC | PRN
  Administered 2020-11-09: 12 mL

## 2020-11-09 MED ORDER — KETOROLAC TROMETHAMINE 30 MG/ML IJ SOLN
INTRAMUSCULAR | Status: DC | PRN
  Administered 2020-11-09: 30 mg via INTRAVENOUS

## 2020-11-09 MED ORDER — DEXMEDETOMIDINE (PRECEDEX) IN NS 20 MCG/5ML (4 MCG/ML) IV SYRINGE
PREFILLED_SYRINGE | INTRAVENOUS | Status: DC | PRN
  Administered 2020-11-09: 8 ug via INTRAVENOUS

## 2020-11-09 MED ORDER — DEXAMETHASONE SODIUM PHOSPHATE 10 MG/ML IJ SOLN
INTRAMUSCULAR | Status: DC | PRN
  Administered 2020-11-09: 10 mg via INTRAVENOUS

## 2020-11-09 MED ORDER — OXYCODONE HCL 5 MG/5ML PO SOLN: 5.0000 mg | Freq: Once | ORAL | Status: DC | PRN

## 2020-11-09 MED ORDER — POVIDONE-IODINE 10 % EX SWAB: 2.0000 "application " | Freq: Once | CUTANEOUS | Status: DC

## 2020-11-09 MED ORDER — OXYCODONE HCL 5 MG PO TABS: 5.0000 mg | ORAL_TABLET | Freq: Once | ORAL | Status: DC | PRN

## 2020-11-09 MED ORDER — FENTANYL CITRATE (PF) 100 MCG/2ML IJ SOLN
INTRAMUSCULAR | Status: DC | PRN
  Administered 2020-11-09: 50 ug via INTRAVENOUS
  Administered 2020-11-09: 100 ug via INTRAVENOUS
  Administered 2020-11-09: 50 ug via INTRAVENOUS

## 2020-11-09 SURGICAL SUPPLY — 31 items
ADH SKN CLS APL DERMABOND .7 (GAUZE/BANDAGES/DRESSINGS) ×1
BAG SPEC RTRVL LRG 6X4 10 (ENDOMECHANICALS)
CABLE HIGH FREQUENCY MONO STRZ (ELECTRODE) IMPLANT
DERMABOND ADVANCED (GAUZE/BANDAGES/DRESSINGS) ×1
DERMABOND ADVANCED .7 DNX12 (GAUZE/BANDAGES/DRESSINGS) ×1 IMPLANT
DRSG OPSITE POSTOP 3X4 (GAUZE/BANDAGES/DRESSINGS) IMPLANT
DURAPREP 26ML APPLICATOR (WOUND CARE) ×2 IMPLANT
GAUZE 4X4 16PLY ~~LOC~~+RFID DBL (SPONGE) ×2 IMPLANT
GLOVE SURG LTX SZ6.5 (GLOVE) ×2 IMPLANT
GLOVE SURG UNDER POLY LF SZ7 (GLOVE) ×8 IMPLANT
GOWN STRL REUS W/TWL LRG LVL3 (GOWN DISPOSABLE) ×8 IMPLANT
KIT TURNOVER CYSTO (KITS) ×2 IMPLANT
LIGASURE VESSEL 5MM BLUNT TIP (ELECTROSURGICAL) ×2 IMPLANT
MANIPULATOR UTERINE 4.5 ZUMI (MISCELLANEOUS) IMPLANT
NEEDLE INSUFFLATION 120MM (ENDOMECHANICALS) ×2 IMPLANT
PACK LAPAROSCOPY BASIN (CUSTOM PROCEDURE TRAY) ×2 IMPLANT
PACK TRENDGUARD 450 HYBRID PRO (MISCELLANEOUS) ×1 IMPLANT
POUCH SPECIMEN RETRIEVAL 10MM (ENDOMECHANICALS) IMPLANT
PROTECTOR NERVE ULNAR (MISCELLANEOUS) ×2 IMPLANT
SCISSORS LAP 5X35 DISP (ENDOMECHANICALS) IMPLANT
SET IRRIG TUBING LAPAROSCOPIC (IRRIGATION / IRRIGATOR) IMPLANT
SET TUBE SMOKE EVAC HIGH FLOW (TUBING) ×2 IMPLANT
SUT VICRYL 0 UR6 27IN ABS (SUTURE) ×2 IMPLANT
SUT VICRYL 4-0 PS2 18IN ABS (SUTURE) ×2 IMPLANT
SYR 5ML LL (SYRINGE) ×2 IMPLANT
TOWEL OR 17X26 10 PK STRL BLUE (TOWEL DISPOSABLE) ×4 IMPLANT
TRAY FOLEY W/BAG SLVR 14FR LF (SET/KITS/TRAYS/PACK) ×2 IMPLANT
TRENDGUARD 450 HYBRID PRO PACK (MISCELLANEOUS) ×2
TROCAR XCEL NON-BLD 11X100MML (ENDOMECHANICALS) IMPLANT
TROCAR XCEL NON-BLD 5MMX100MML (ENDOMECHANICALS) ×6 IMPLANT
WARMER LAPAROSCOPE (MISCELLANEOUS) ×2 IMPLANT

## 2020-11-09 NOTE — Op Note (Signed)
Stefanie Jimenez [birth year] [id number]   Operative Note  PROCEDURE: laparoscopic bilateral salpingectomy, IUD removal  PRE-OPERATIVE DIAGNOSIS: desires permanent female sterilization  POST-OPERATIVE DIAGNOSIS: desires permanent female sterilization  SURGEON: Dr. Langley Gauss, DO  ASSISTANT: Lars Pinks, CNM  FINDINGS: pelvic exam reveals normal external female genitalia, multiparous ectocervix without lesions, Mirena IUD removal and disposed of, laparoscopy reveals  female pelvic anatomy including enlarged fibroid uterus approximately 9 week size with fibroid burden primarily fundal and posterior, and normal appearing bilateral fallopian tubes and ovaries  SPECIMENS: bilateral fallopian tubes  EBL: minimal  FLUIDS:  per anesthesia  COMPLICATIONS: None  PROCEDURE IN DETAIL:   After the patient was appropriately consented in the holding area, she was taken to the operating room where general anesthesia was administered without complications. The patient was placed in the dorsal lithotomy position. Bilateral arms were tucked with the appropriate barrier padding. The patient was prepped and draped in the usual sterile fashion. The bladder was trained with a catheter. An appropriate time out was performed that verified the correct patient, procedure, and surgical team.   Pelvic exam performed. A sterile speculum was inserted into the vagina and the cervix was visualized. A single tooth tenaculum was used to grasp the anterior lip of the cervix. A Hulka uterine manipulator was inserted through the cervix and secured into place. Attention was turned to the abdomen.  A scalpel was used to make a small transverse skin incision just inferior to the umbilicus. The Veress needle was advanced through the skin incision until 2 clicks were appreciated. The gas was connected and turned on and a low opening pressure was noted. Pneumoperitoneum was achieved without complications. A 5 mm  laparoscope and trocar sleeve was inserted through the umbilical incision and the abdomen was entered under direct visualization. Inspection of the abdomen revealed the findings as noted above and no obvious injuries noted. The patient was placed in Trendelenburg positioning to facilitate pelvic visualization. Lower quadrant ports were placed bilaterally at points approximately 2 cm superior and 2 cm medial to the ASIS. 5 mm trocars were placed bilaterally under direct laparoscopic visualization. Starting on the left side, the LigaSure device was used to sequentially cauterize and cut the mesosalpinx just inferior to the fallopian tube and transecting the fallopian tube medially at the cornual region. Attention was then turned to the right and transection performed in a similar fashion. The fallopian tubes were removed through the lateral ports and sent for final surgical pathology. Excellent hemostasis was noted at the surgical site. The pressure was reduced and the patient laid flat and continued hemostasis noted. The gas was released from the abdomen. The skin incisions were closed with 4-0 Vicryl and skin glue. Local anesthetic using 0.25% Marcaine was injected at the incision sites. The uterine manipulator was removed. The patient tolerated the procedure well and was taken to the recovery room in stable condition. All lap, needle, and instrument counts were correct.  [name] [name] 11/09/20 1:04 PM

## 2020-11-09 NOTE — Discharge Instructions (Signed)

## 2020-11-09 NOTE — H&P (Signed)
Stefanie Jimenez is an 40 y.o. female presenting for scheduled surgery.  40Y [id number] LMP 10/21/20/22 presenting for elective sterilization by laparoscopic bilateral salpingectomy  New patient seen August 2022 for AEX and discussion of permanent sterilization Has been using Mirena IUD for contraception since April 2018 -Does have more cramping and irregular bleeding with IUD -Known hx of uterine fibroids Patient is done with family building and does not desire any future pregnancies G1 20Y son SVD G2 middle child passed away a few weeks after delivery- 26w stat c/s for ?NRFHT- unsure of details but denies any PEC or known abruption G3 9YO daughter repeat c/s   Up to date on AEX as NGY 09/03/20 including normal Pap  Hx c/s x2 no other abdominal surgeries  No known medical problems including HTN, DM, or asthma. Non-smoker Patient is up to date with PCP as well   Menstrual History:  Patient's last menstrual period was 10/21/2020 (approximate).    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 Harrington Challenger, MD;  Location: [facility];  Service: Gynecology;  Laterality: N/A;   REPEAT CESAREAN SECTION      Family History  Family history unknown: Yes    Social History:  reports that she has never smoked. She has never used smokeless tobacco. She reports that she does not currently use alcohol. She reports that she does not use drugs.  Allergies: No Known Allergies  Medications Prior to Admission  Medication Sig Dispense Refill Last Dose   APPLE CIDER VINEGAR PO Take by mouth.      Multiple Vitamin (MULTIVITAMIN WITH MINERALS) TABS tablet Take 1 tablet by  mouth daily.       Review of Systems  All other systems reviewed and are negative.  Height 5\' 5"  (1.651 m), weight 123.8 kg, last menstrual period 10/21/2020, unknown if currently breastfeeding. Physical Exam Vitals reviewed.  Constitutional:      Appearance: Normal appearance. She is obese.  HENT:     Head: Normocephalic.  Cardiovascular:     Rate and Rhythm: Normal rate.  Pulmonary:     Effort: Pulmonary effort is normal.  Abdominal:     Palpations: Abdomen is soft.  Genitourinary:    General: Normal vulva.  Musculoskeletal:        General: Normal range of motion.     Cervical back: Normal range of motion.  Skin:    General: Skin is warm and dry.  Neurological:     General: No focal deficit present.     Mental Status: She is alert and oriented to person, place, and time.  Psychiatric:        Mood and Affect: Mood normal.        Behavior: Behavior normal.     Assessment/Plan: 13Y [id number] female desiring permanent sterilization scheduled for laparoscopic bilateral salpingectomy  Patient has been adequately counseled on contraceptive options in the office on multiple visits. Patient is 962% certain of no desire for future fertility and requesting permanent female sterilization. She was counseled on risks of regret and offered continued Mirena IUD or other LARC alternatives. Patient declines LARC and  would like to proceed with sterilization. Counseled on risks of laparoscopic surgery including but not limited to bleeding, infection, and damage to surrounding organs. We discussed tubal ligation versus salpingectomy for additional benefit of ovarian cancer risk reduction. Patient would like to proceed with bilateral salpingectomy.   -Admit to OR -UPT Pending -No antibiotics indicated -SCD VTE ppx -Routine intraop/postop care -Anticipate DC home today with follow up in office in 2 weeks  Melisse Caetano A Tarek Cravens 11/09/2020, 9:48 AM

## 2020-11-09 NOTE — Transfer of Care (Signed)
Immediate Anesthesia Transfer of Care Note  Patient: Stefanie Jimenez  Procedure(s) Performed: LAPAROSCOPIC TUBAL LIGATION By Salpingectomy; IUD REMOVAL (Bilateral: Abdomen)  Patient Location: PACU  Anesthesia Type:General  Level of Consciousness: awake, alert , oriented and patient cooperative  Airway & Oxygen Therapy: Patient Spontanous Breathing  Post-op Assessment: Report given to RN and Post -op Vital signs reviewed and stable  Post vital signs: Reviewed and stable  Last Vitals:  Vitals Value Taken Time  BP 120/70 11/09/20 1300  Temp    Pulse 99 11/09/20 1300  Resp 14 11/09/20 1304  SpO2 100 % 11/09/20 1300  Vitals shown include unvalidated device data.  Last Pain:  Vitals:   11/09/20 1001  TempSrc: Oral      Patients Stated Pain Goal: 5 (23/36/12 2449)  Complications: No notable events documented.

## 2020-11-09 NOTE — Anesthesia Procedure Notes (Signed)
Procedure Name: Intubation Date/Time: 11/09/2020 11:56 AM Performed by: Rogers Blocker, CRNA Pre-anesthesia Checklist: Patient identified, Emergency Drugs available, Suction available and Patient being monitored Patient Re-evaluated:Patient Re-evaluated prior to induction Oxygen Delivery Method: Circle System Utilized Preoxygenation: Pre-oxygenation with 100% oxygen Induction Type: IV induction Ventilation: Mask ventilation without difficulty Laryngoscope Size: Mac and 3 Grade View: Grade I Tube type: Oral Tube size: 7.0 mm Number of attempts: 1 Airway Equipment and Method: Stylet and Bite block Placement Confirmation: ETT inserted through vocal cords under direct vision, positive ETCO2 and breath sounds checked- equal and bilateral Secured at: 22 cm Tube secured with: Tape Dental Injury: Teeth and Oropharynx as per pre-operative assessment

## 2020-11-09 NOTE — Anesthesia Preprocedure Evaluation (Addendum)
Anesthesia Evaluation  Patient identified by MRN, date of birth, ID band Patient awake    Reviewed: Allergy & Precautions, NPO status , Patient's Chart, lab work & pertinent test results  History of Anesthesia Complications Negative for: history of anesthetic complications  Airway Mallampati: III  TM Distance: >3 FB Neck ROM: Full    Dental  (+) Dental Advisory Given, Teeth Intact   Pulmonary neg pulmonary ROS,    breath sounds clear to auscultation       Cardiovascular negative cardio ROS   Rhythm:Regular     Neuro/Psych  Headaches, negative psych ROS   GI/Hepatic Neg liver ROS, GERD  Controlled,  Endo/Other  Morbid obesity  Renal/GU negative Renal ROS     Musculoskeletal negative musculoskeletal ROS (+)   Abdominal   Peds  Hematology  (+) Blood dyscrasia, anemia , Lab Results      Component                Value               Date                      WBC                      8.0                 11/06/2020                HGB                      11.8 (L)            11/06/2020                HCT                      38.3                11/06/2020                MCV                      93.4                11/06/2020                PLT                      265                 11/06/2020              Anesthesia Other Findings   Reproductive/Obstetrics Lab Results      Component                Value               Date                      PREGTESTUR               NEGATIVE            11/09/2020  Anesthesia Physical Anesthesia Plan  ASA: 3  Anesthesia Plan: General   Post-op Pain Management:    Induction: Intravenous  PONV Risk Score and Plan: 2 and Ondansetron, Dexamethasone and Midazolam  Airway Management Planned: Oral ETT  Additional Equipment: None  Intra-op Plan:   Post-operative Plan: Extubation in OR  Informed Consent: I have  reviewed the patients History and Physical, chart, labs and discussed the procedure including the risks, benefits and alternatives for the proposed anesthesia with the patient or authorized representative who has indicated his/her understanding and acceptance.     Dental advisory given  Plan Discussed with: CRNA and Anesthesiologist  Anesthesia Plan Comments: (Toradol intraop)        Anesthesia Quick Evaluation

## 2020-11-12 ENCOUNTER — Encounter (HOSPITAL_BASED_OUTPATIENT_CLINIC_OR_DEPARTMENT_OTHER): Payer: Self-pay | Admitting: Obstetrics and Gynecology

## 2020-11-12 LAB — SURGICAL PATHOLOGY

## 2020-11-15 NOTE — Anesthesia Postprocedure Evaluation (Signed)
Anesthesia Post Note  Patient: Stefanie Jimenez  Procedure(s) Performed: LAPAROSCOPIC TUBAL LIGATION By Salpingectomy; IUD REMOVAL (Bilateral: Abdomen)     Patient location during evaluation: PACU Anesthesia Type: General Level of consciousness: awake and alert Pain management: pain level controlled Vital Signs Assessment: post-procedure vital signs reviewed and stable Respiratory status: spontaneous breathing, nonlabored ventilation, respiratory function stable and patient connected to nasal cannula oxygen Cardiovascular status: blood pressure returned to baseline and stable Postop Assessment: no apparent nausea or vomiting Anesthetic complications: no   No notable events documented.  Last Vitals:  Vitals:   11/09/20 1345 11/09/20 1408  BP: 123/73 113/84  Pulse:  84  Resp: 13 14  Temp:  36.8 C  SpO2:  100%    Last Pain:  Vitals:   11/12/20 1530  TempSrc:   PainSc: 0-No pain                 Aliyha Fornes

## 2021-09-22 ENCOUNTER — Emergency Department (HOSPITAL_BASED_OUTPATIENT_CLINIC_OR_DEPARTMENT_OTHER): Payer: BC Managed Care – PPO | Admitting: Radiology

## 2021-09-22 ENCOUNTER — Other Ambulatory Visit: Payer: Self-pay

## 2021-09-22 ENCOUNTER — Encounter (HOSPITAL_BASED_OUTPATIENT_CLINIC_OR_DEPARTMENT_OTHER): Payer: Self-pay | Admitting: Emergency Medicine

## 2021-09-22 ENCOUNTER — Emergency Department (HOSPITAL_BASED_OUTPATIENT_CLINIC_OR_DEPARTMENT_OTHER)
Admission: EM | Admit: 2021-09-22 | Discharge: 2021-09-22 | Disposition: A | Discharge status: Home / Self Care | Payer: BC Managed Care – PPO | Attending: Emergency Medicine | Admitting: Emergency Medicine

## 2021-09-22 ENCOUNTER — Emergency Department (HOSPITAL_BASED_OUTPATIENT_CLINIC_OR_DEPARTMENT_OTHER): Payer: BC Managed Care – PPO

## 2021-09-22 DIAGNOSIS — J45909 Unspecified asthma, uncomplicated: Secondary | ICD-10-CM | POA: Insufficient documentation

## 2021-09-22 DIAGNOSIS — J449 Chronic obstructive pulmonary disease, unspecified: Secondary | ICD-10-CM | POA: Diagnosis not present

## 2021-09-22 DIAGNOSIS — R0789 Other chest pain: Secondary | ICD-10-CM | POA: Insufficient documentation

## 2021-09-22 DIAGNOSIS — I10 Essential (primary) hypertension: Secondary | ICD-10-CM | POA: Insufficient documentation

## 2021-09-22 LAB — BASIC METABOLIC PANEL
Anion gap: 9 (ref 5–15)
BUN: 12 mg/dL (ref 6–20)
CO2: 23 mmol/L (ref 22–32)
Calcium: 8.8 mg/dL — ABNORMAL LOW (ref 8.9–10.3)
Chloride: 104 mmol/L (ref 98–111)
Creatinine, Ser: 0.83 mg/dL (ref 0.44–1.00)
GFR, Estimated: >60 mL/min (ref >60)
Glucose, Bld: 94 mg/dL (ref 70–99)
Potassium: 4.1 mmol/L (ref 3.5–5.1)
Sodium: 136 mmol/L (ref 135–145)

## 2021-09-22 LAB — TROPONIN I (HIGH SENSITIVITY)
Troponin I (High Sensitivity): 2 ng/L (ref <18)
Troponin I (High Sensitivity): 2 ng/L (ref <18)

## 2021-09-22 LAB — CBC
HCT: 33.9 % — ABNORMAL LOW (ref 36.0–46.0)
Hemoglobin: 10.8 g/dL — ABNORMAL LOW (ref 12.0–15.0)
MCH: 28 pg (ref 26.0–34.0)
MCHC: 31.9 g/dL (ref 30.0–36.0)
MCV: 87.8 fL (ref 80.0–100.0)
Platelets: 277 10*3/uL (ref 150–400)
RBC: 3.86 MIL/uL — ABNORMAL LOW (ref 3.87–5.11)
RDW: 13.3 % (ref 11.5–15.5)
WBC: 7.8 10*3/uL (ref 4.0–10.5)
nRBC: 0 % (ref 0.0–0.2)

## 2021-09-22 LAB — PREGNANCY, URINE: Preg Test, Ur: NEGATIVE

## 2021-09-22 LAB — D-DIMER, QUANTITATIVE: D-Dimer, Quant: 0.79 ug/mL-FEU — ABNORMAL HIGH (ref 0.00–0.50)

## 2021-09-22 MED ORDER — IOHEXOL 350 MG/ML SOLN
100.0000 mL | Freq: Once | INTRAVENOUS | Status: AC | PRN
  Administered 2021-09-22: 80 mL via INTRAVENOUS

## 2021-09-22 NOTE — ED Triage Notes (Signed)
Upper back pain started Wednesday,didn't go away and on Friday chest started hurting /feels pressure. Not going away.

## 2021-09-22 NOTE — ED Notes (Signed)
Discharge paperwork given and verbally understood. 

## 2021-09-22 NOTE — Discharge Instructions (Signed)
It was a pleasure taking care of you today!   Your workup was negative in the ED. at this time, there is no evidence of pneumonia or blood clot in the lung.  You may apply ice or heat to the affected area for 15 minutes at a time.  Ensure to place a barrier between your skin and and the ice/heat.  You may use over-the-counter 1,000 mg Tylenol every 6 hours or 600 mg ibuprofen every 6 hours as needed for pain.  Follow-up with your primary care provider for evaluation of your symptoms. If you do not have a primary care provider, you may follow-up with the [facility] as needed.  Attached is an ambulatory referral to cardiology, they will call you to set up a follow-up appointment regarding today's ED visit.  You may return to the ED if you are experiencing increasing/worsening chest pain, shortness of breath, or worsening symptoms.

## 2021-09-22 NOTE — ED Provider Notes (Signed)
[facility] Provider Note   CSN: [id number] Arrival date & time: 09/22/21  1129     History  Chief Complaint  Patient presents with   Chest Pain    Stefanie Jimenez is a 41 y.o. female who presents to the ED with concerns for chest pain onset 2 days. Symptoms started with mid back pain. Warm compress provides relief of her symptoms. She notes that movement makes her pain worse and causes chest tightness. This morning she noted pain with massaging her sternal chest. Denies history of MI, cardiac cath, stents, recent injury, fall, twisting, DM, HTN, asthma, COPD.   The history is provided by the patient. No language interpreter was used.       Home Medications Prior to Admission medications   Medication Sig Start Date End Date Taking? Authorizing Provider  APPLE CIDER VINEGAR PO Take by mouth.    [provider]  HYDROcodone-acetaminophen (NORCO/VICODIN) 5-325 MG tablet Take 1 tablet by mouth every 6 (six) hours as needed for moderate pain. 11/09/20   Law, Cassandra A, DO  ibuprofen (ADVIL) 800 MG tablet Take 1 tablet (800 mg total) by mouth every 8 (eight) hours as needed. 11/09/20   Law, Cassandra A, DO  Multiple Vitamin (MULTIVITAMIN WITH MINERALS) TABS tablet Take 1 tablet by mouth daily.    [provider]      Allergies    Patient has no known allergies.    Review of Systems   Review of Systems  Constitutional:  Negative for fever.  Respiratory:  Negative for shortness of breath.   Cardiovascular:  Positive for chest pain. Negative for palpitations and leg swelling.  Gastrointestinal:  Negative for nausea and vomiting.  All other systems reviewed and are negative.   Physical Exam Updated Vital Signs BP 136/86 (BP Location: Right Wrist)   Pulse 86   Temp 98.7 F (37.1 C)   Resp 19   SpO2 100%  Physical Exam Vitals and nursing note reviewed.  Constitutional:      General: She is not in acute distress.     Appearance: She is not diaphoretic.  HENT:     Head: Normocephalic and atraumatic.     Mouth/Throat:     Pharynx: No oropharyngeal exudate.  Eyes:     General: No scleral icterus.    Conjunctiva/sclera: Conjunctivae normal.  Cardiovascular:     Rate and Rhythm: Normal rate and regular rhythm.     Pulses: Normal pulses.     Heart sounds: Normal heart sounds.  Pulmonary:     Effort: Pulmonary effort is normal. No respiratory distress.     Breath sounds: Normal breath sounds. No wheezing.  Chest:     Chest wall: Tenderness present.     Comments: Chest wall TTP Abdominal:     General: Bowel sounds are normal.     Palpations: Abdomen is soft. There is no mass.     Tenderness: There is no abdominal tenderness. There is no guarding or rebound.  Musculoskeletal:        General: Normal range of motion.     Cervical back: Normal range of motion and neck supple.  Skin:    General: Skin is warm and dry.  Neurological:     Mental Status: She is alert.  Psychiatric:        Behavior: Behavior normal.     ED Results / Procedures / Treatments   Labs (all labs ordered are listed, but only abnormal results are displayed) Labs  Reviewed  BASIC METABOLIC PANEL - Abnormal; Notable for the following components:      Result Value   Calcium 8.8 (*)    All other components within normal limits  CBC - Abnormal; Notable for the following components:   RBC 3.86 (*)    Hemoglobin 10.8 (*)    HCT 33.9 (*)    All other components within normal limits  D-DIMER, QUANTITATIVE - Abnormal; Notable for the following components:   D-Dimer, Quant 0.79 (*)    All other components within normal limits  PREGNANCY, URINE  TROPONIN I (HIGH SENSITIVITY)  TROPONIN I (HIGH SENSITIVITY)    EKG EKG Interpretation  Date/Time:  Sunday September 22 2021 11:36:58 EDT Ventricular Rate:  105 PR Interval:  124 QRS Duration: 74 QT Interval:  330 QTC Calculation: 436 R Axis:   18 Text Interpretation: Sinus  tachycardia Otherwise normal ECG No previous ECGs available Confirmed by Octaviano Glow [phone]) on 09/22/2021 4:26:22 PM  Radiology CT Angio Chest PE W and/or Wo Contrast  Result Date: 09/22/2021 CLINICAL DATA:  Positive D-dimer, upper back pain since Wednesday which has not gone away, chest pain and pressure beginning Friday EXAM: CT ANGIOGRAPHY CHEST WITH CONTRAST TECHNIQUE: Multidetector CT imaging of the chest was performed using the standard protocol during bolus administration of intravenous contrast. Multiplanar CT image reconstructions and MIPs were obtained to evaluate the vascular anatomy. RADIATION DOSE REDUCTION: This exam was performed according to the departmental dose-optimization program which includes automated exposure control, adjustment of the mA and/or kV according to patient size and/or use of iterative reconstruction technique. CONTRAST:  94m OMNIPAQUE IOHEXOL 350 MG/ML SOLN IV COMPARISON:  None available FINDINGS: Cardiovascular: Aorta normal caliber without aneurysm or dissection. Heart unremarkable. No pericardial effusion. Pulmonary arteries adequately opacified and patent. No evidence of pulmonary embolism. Mediastinum/Nodes: Minimal stranding in anterior mediastinal fat likely represents residual thymic tissue. No thoracic adenopathy. Base of cervical region normal appearance. Esophagus unremarkable. Lungs/Pleura: Lungs clear. No pulmonary infiltrate, pleural effusion, or pneumothorax. Upper Abdomen: Visualized upper abdomen unremarkable Musculoskeletal: Normal appearance Review of the MIP images confirms the above findings. IMPRESSION: Normal exam. Electronically Signed   By: MLavonia DanaM.D.   On: 09/22/2021 16:28   DG Chest 2 View  Result Date: 09/22/2021 CLINICAL DATA:  Chest pain EXAM: CHEST - 2 VIEW COMPARISON:  None Available. FINDINGS: The heart size and mediastinal contours are within normal limits. Both lungs are clear. The visualized skeletal structures are  unremarkable. IMPRESSION: No active cardiopulmonary disease. Electronically Signed   By: PElmer PickerM.D.   On: 09/22/2021 12:28    Procedures Procedures    Medications Ordered in ED Medications  iohexol (OMNIPAQUE) 350 MG/ML injection 100 mL (80 mLs Intravenous Contrast Given 09/22/21 1607)    ED Course/ Medical Decision Making/ A&P Clinical Course as of 09/23/21 1955  Sun Sep 22, 2021  1349 Offered pain meds, pt declines at this time. [SB]  1637 Pt re-evaluated and discussed with patient treatment plan. Answered all available questions.  [SB]  14193Discussed with patient discharge treatment plan. Answered all available questions. Pt appears safe for discharge at this time.  [SB]    Clinical Course User Index [SB] Donald Memoli A, PA-C                           Medical Decision Making Amount and/or Complexity of Data Reviewed Labs: ordered. Radiology: ordered.  Risk Prescription drug management.   Patient presents  to the ED with chest pain x 2 days. No prior history of MI, catheterization, DVT/PE. Vital signs pt afebrile, patient not hypoxic or tachycardic. On exam patient with chest wall TTP. Otherwise, no acute cardiovascular, respiratory, abdominal findings. Differential diagnosis includes ACS, aortic dissection, pneumothorax, PE, PNA.    Labs:  I ordered, and personally interpreted labs.  The pertinent results include:   Initial and delta troponin at 2  CBC without leukocytosis BMP unremarkable D-dimer elevated at 0.79 Negative preg urine  Imaging: I ordered imaging studies including CXR, CTA chest I independently visualized and interpreted imaging which showed: no acute findings I agree with the radiologist interpretation   Disposition: Presentation suspicious for atypical chest pain. EKG without acute ST/T changes, troponins negative, chest x-ray negative, low suspicion for ACS at this time. Chest x-ray without acute findings, vital signs stable, doubt  aortic dissection or pneumothorax at this time.  doubt PE at this time, CTA chest negative. Case discussed with attending who agrees with discharge treatment plan at this time. After consideration of the diagnostic results and the patients response to treatment, I feel that the patient would benefit from Discharge home. Ambulatory referral to cardiology placed. Supportive care measures and strict return precautions discussed with patient at bedside. Pt acknowledges and verbalizes understanding. Pt appears safe for discharge. Follow up as indicated in discharge paperwork.   This chart was dictated using voice recognition software, Dragon. Despite the best efforts of this provider to proofread and correct errors, errors may still occur which can change documentation meaning.  Final Clinical Impression(s) / ED Diagnoses Final diagnoses:  Atypical chest pain    Rx / DC Orders ED Discharge Orders          Ordered    Ambulatory referral to Cardiology       Comments: If you have not heard from the Cardiology office within the next 72 hours please call [phone].   09/22/21 1730              Ennifer Harston A, PA-C 09/23/21 Beason, Winslow, DO 11/07/21 1735

## 2021-10-09 ENCOUNTER — Encounter: Payer: Self-pay | Admitting: *Deleted

## 2021-10-14 ENCOUNTER — Other Ambulatory Visit (HOSPITAL_COMMUNITY): Payer: Self-pay

## 2021-12-10 ENCOUNTER — Encounter (INDEPENDENT_AMBULATORY_CARE_PROVIDER_SITE_OTHER): Payer: Self-pay

## 2022-04-10 ENCOUNTER — Encounter (HOSPITAL_BASED_OUTPATIENT_CLINIC_OR_DEPARTMENT_OTHER): Payer: Self-pay | Admitting: Pharmacist

## 2022-04-10 ENCOUNTER — Other Ambulatory Visit (HOSPITAL_BASED_OUTPATIENT_CLINIC_OR_DEPARTMENT_OTHER): Payer: Self-pay

## 2022-04-10 MED ORDER — WEGOVY 0.25 MG/0.5ML ~~LOC~~ SOAJ
0.2500 mg | SUBCUTANEOUS | 0 refills | Status: AC
  Filled 2022-04-10: qty 2, 28d supply, fill #0

## 2022-10-13 ENCOUNTER — Encounter: Payer: Self-pay | Admitting: General Practice

## 2023-08-22 ENCOUNTER — Other Ambulatory Visit: Payer: Self-pay

## 2023-08-22 ENCOUNTER — Emergency Department (HOSPITAL_BASED_OUTPATIENT_CLINIC_OR_DEPARTMENT_OTHER)
Admission: EM | Admit: 2023-08-22 | Discharge: 2023-08-23 | Disposition: A | Discharge status: Home / Self Care | Attending: Emergency Medicine | Admitting: Emergency Medicine

## 2023-08-22 DIAGNOSIS — R300 Dysuria: Secondary | ICD-10-CM | POA: Diagnosis present

## 2023-08-22 DIAGNOSIS — N3000 Acute cystitis without hematuria: Secondary | ICD-10-CM | POA: Insufficient documentation

## 2023-08-22 LAB — URINALYSIS, ROUTINE W REFLEX MICROSCOPIC
Bilirubin Urine: NEGATIVE
Glucose, UA: NEGATIVE mg/dL
Hgb urine dipstick: NEGATIVE
Ketones, ur: NEGATIVE mg/dL
Nitrite: NEGATIVE
Protein, ur: 30 mg/dL — AB
Specific Gravity, Urine: 1.029 (ref 1.005–1.030)
WBC, UA: >50 WBC/hpf (ref 0–5)
pH: 7 (ref 5.0–8.0)

## 2023-08-22 LAB — PREGNANCY, URINE: Preg Test, Ur: NEGATIVE

## 2023-08-22 NOTE — ED Triage Notes (Signed)
 Pt POV concerned for UTI, reporting dysuria and increased frequency x1 week.

## 2023-08-23 MED ORDER — CEPHALEXIN 500 MG PO CAPS: 500.0000 mg | ORAL_CAPSULE | Freq: Two times a day (BID) | ORAL | 0 refills | Status: AC

## 2023-08-23 MED ORDER — CEPHALEXIN 250 MG PO CAPS
500.0000 mg | ORAL_CAPSULE | Freq: Once | ORAL | Status: AC
  Administered 2023-08-23: 500 mg via ORAL
  Filled 2023-08-23: qty 2

## 2023-08-23 NOTE — ED Provider Notes (Signed)
 [facility] DEPARTMENT AT [facility]  Provider Note  CSN: [id number] Arrival date & time: 08/22/23 2108  History Chief Complaint  Patient presents with   Dysuria    Stefanie Jimenez is a 43 y.o. female reports 2-3 days of dysuria and urgency. No discharge or bleeding. Recently treated for trichomonas, but those symptoms have resolved. Currently sexually active. No fever, vomiting or flank pain.    Home Medications Prior to Admission medications   Medication Sig Start Date End Date Taking? Authorizing Provider  cephALEXin  (KEFLEX ) 500 MG capsule Take 1 capsule (500 mg total) by mouth 2 (two) times daily for 7 days. 08/23/23 08/30/23 Yes Roselyn Carlin NOVAK, MD  APPLE CIDER VINEGAR PO Take by mouth.    [provider]  HYDROcodone -acetaminophen  (NORCO/VICODIN) 5-325 MG tablet Take 1 tablet by mouth every 6 (six) hours as needed for moderate pain. 11/09/20   Law, Cassandra A, DO  ibuprofen  (ADVIL ) 800 MG tablet Take 1 tablet (800 mg total) by mouth every 8 (eight) hours as needed. 11/09/20   Law, Cassandra A, DO  Multiple Vitamin (MULTIVITAMIN WITH MINERALS) TABS tablet Take 1 tablet by mouth daily.    [provider]  Semaglutide -Weight Management (WEGOVY ) 0.25 MG/0.5ML SOAJ Inject 0.25 mg into the skin once a week. 04/10/22        Allergies    Patient has no known allergies.   Review of Systems   Review of Systems Please see HPI for pertinent positives and negatives  Physical Exam BP 131/79   Pulse 94   Temp 98.9 F (37.2 C) (Oral)   Resp 17   Ht 5' 5 (1.651 m)   Wt 113.4 kg   LMP 07/26/2023 (Exact Date)   SpO2 100%   BMI 41.60 kg/m   Physical Exam Vitals and nursing note reviewed.  Constitutional:      Appearance: Normal appearance.  HENT:     Head: Normocephalic and atraumatic.     Nose: Nose normal.     Mouth/Throat:     Mouth: Mucous membranes are moist.  Eyes:     Extraocular Movements: Extraocular movements intact.      Conjunctiva/sclera: Conjunctivae normal.  Cardiovascular:     Rate and Rhythm: Normal rate.  Pulmonary:     Effort: Pulmonary effort is normal.     Breath sounds: Normal breath sounds.  Abdominal:     General: Abdomen is flat.     Palpations: Abdomen is soft.     Tenderness: There is no abdominal tenderness.  Musculoskeletal:        General: No swelling. Normal range of motion.     Cervical back: Neck supple.  Skin:    General: Skin is warm and dry.  Neurological:     General: No focal deficit present.     Mental Status: She is alert.  Psychiatric:        Mood and Affect: Mood normal.     ED Results / Procedures / Treatments   EKG None  Procedures Procedures  Medications Ordered in the ED Medications  cephALEXin  (KEFLEX ) capsule 500 mg (500 mg Oral Given 08/23/23 0026)    Initial Impression and Plan  Patient here with symptoms and UA consistent with uncomplicated UTI. Will send for culture. Start Keflex . PCP follow up, RTED for any other concerns.    ED Course       MDM Rules/Calculators/A&P Medical Decision Making Problems Addressed: Acute cystitis without hematuria: acute illness or injury  Amount and/or  Complexity of Data Reviewed Labs: ordered. Decision-making details documented in ED Course.  Risk Prescription drug management.     Final Clinical Impression(s) / ED Diagnoses Final diagnoses:  Acute cystitis without hematuria    Rx / DC Orders ED Discharge Orders          Ordered    cephALEXin  (KEFLEX ) 500 MG capsule  2 times daily        08/23/23 0027             Roselyn Carlin NOVAK, MD 08/23/23 [phone]

## 2023-08-25 LAB — URINE CULTURE: Culture: >=100000 — AB

## 2023-08-26 ENCOUNTER — Telehealth (HOSPITAL_BASED_OUTPATIENT_CLINIC_OR_DEPARTMENT_OTHER): Payer: Self-pay | Admitting: *Deleted

## 2023-08-26 NOTE — Telephone Encounter (Signed)
 Post ED Visit - Positive Culture Follow-up  Culture report reviewed by antimicrobial stewardship pharmacist: [facility] [x]  Peck,  [place].D. []  Venetia Gully, Pharm.D., BCPS AQ-ID []  Garrel Crews, Pharm.D., BCPS []  Almarie Lunger, Pharm.D., BCPS []  [place], [address].D., BCPS, AAHIVP []  Rosaline Bihari, Pharm.D., BCPS, AAHIVP []  Vernell Meier, PharmD, BCPS []  Latanya Hint, PharmD, BCPS []  Donald Medley, PharmD, BCPS []  Rocky Bold, PharmD []  Dorothyann Alert, PharmD, BCPS []  Morene Babe, PharmD  [facility] []  Rosaline Edison, PharmD []  Romona Bliss, PharmD []  Dolphus Roller, PharmD []  Veva Seip, Rph []  Vernell Daunt) Leonce, PharmD []  Eva Allis, PharmD []  Rosaline Millet, PharmD []  Iantha Batch, PharmD []  Arvin Gauss, PharmD []  Wanda Hasting, PharmD []  Ronal Rav, PharmD []  Rocky Slade, PharmD []  Bard Jeans, PharmD   Positive urine culture Treated with Cephalexin , organism sensitive to the same and no further patient follow-up is required at this time.  Stefanie Jimenez 08/26/2023, 10:46 AM
